# Patient Record
Sex: Male | Born: 1980 | Hispanic: Yes | Marital: Married | Smoking: Current some day smoker
Health system: Southern US, Community
[De-identification: ages and names within clinical notes are randomized; demographics above are authoritative.]

## PROBLEM LIST (undated history)

## (undated) DIAGNOSIS — Z789 Other specified health status: Secondary | ICD-10-CM

## (undated) DIAGNOSIS — K219 Gastro-esophageal reflux disease without esophagitis: Secondary | ICD-10-CM

## (undated) HISTORY — PX: APPENDECTOMY: SHX54

---

## 2017-03-16 ENCOUNTER — Encounter (HOSPITAL_COMMUNITY): Payer: Self-pay

## 2017-03-16 ENCOUNTER — Emergency Department (HOSPITAL_COMMUNITY): Payer: Self-pay

## 2017-03-16 ENCOUNTER — Other Ambulatory Visit: Payer: Self-pay

## 2017-03-16 ENCOUNTER — Inpatient Hospital Stay (HOSPITAL_COMMUNITY): Payer: Self-pay

## 2017-03-16 ENCOUNTER — Inpatient Hospital Stay (HOSPITAL_COMMUNITY)
Admission: EM | Admit: 2017-03-16 | Discharge: 2017-03-17 | DRG: 494 | Disposition: A | Discharge status: Home / Self Care | Payer: Self-pay | Attending: Student | Admitting: Student

## 2017-03-16 ENCOUNTER — Inpatient Hospital Stay (HOSPITAL_COMMUNITY): Payer: Self-pay | Admitting: Certified Registered"

## 2017-03-16 ENCOUNTER — Encounter (HOSPITAL_COMMUNITY)
Admission: EM | Disposition: A | Discharge status: Home / Self Care | Payer: Self-pay | Source: Home / Self Care | Attending: Student

## 2017-03-16 DIAGNOSIS — S82831A Other fracture of upper and lower end of right fibula, initial encounter for closed fracture: Secondary | ICD-10-CM | POA: Diagnosis present

## 2017-03-16 DIAGNOSIS — W11XXXA Fall on and from ladder, initial encounter: Secondary | ICD-10-CM | POA: Diagnosis present

## 2017-03-16 DIAGNOSIS — T148XXA Other injury of unspecified body region, initial encounter: Secondary | ICD-10-CM

## 2017-03-16 DIAGNOSIS — S82201A Unspecified fracture of shaft of right tibia, initial encounter for closed fracture: Principal | ICD-10-CM | POA: Diagnosis present

## 2017-03-16 DIAGNOSIS — S82401A Unspecified fracture of shaft of right fibula, initial encounter for closed fracture: Secondary | ICD-10-CM

## 2017-03-16 DIAGNOSIS — S82899A Other fracture of unspecified lower leg, initial encounter for closed fracture: Secondary | ICD-10-CM

## 2017-03-16 DIAGNOSIS — S82871A Displaced pilon fracture of right tibia, initial encounter for closed fracture: Secondary | ICD-10-CM | POA: Diagnosis present

## 2017-03-16 HISTORY — PX: EXTERNAL FIXATION LEG: SHX1549

## 2017-03-16 LAB — BASIC METABOLIC PANEL
ANION GAP: 9 (ref 5–15)
BUN: 8 mg/dL (ref 6–20)
CALCIUM: 8.4 mg/dL — AB (ref 8.9–10.3)
CO2: 24 mmol/L (ref 22–32)
Chloride: 104 mmol/L (ref 101–111)
Creatinine, Ser: 0.66 mg/dL (ref 0.61–1.24)
GFR calc non Af Amer: >60 mL/min (ref >60)
Glucose, Bld: 95 mg/dL (ref 65–99)
Potassium: 3.8 mmol/L (ref 3.5–5.1)
SODIUM: 137 mmol/L (ref 135–145)

## 2017-03-16 LAB — CBC
HCT: 38.9 % — ABNORMAL LOW (ref 39.0–52.0)
Hemoglobin: 13.3 g/dL (ref 13.0–17.0)
MCH: 31.4 pg (ref 26.0–34.0)
MCHC: 34.2 g/dL (ref 30.0–36.0)
MCV: 92 fL (ref 78.0–100.0)
Platelets: 209 10*3/uL (ref 150–400)
RBC: 4.23 MIL/uL (ref 4.22–5.81)
RDW: 13.6 % (ref 11.5–15.5)
WBC: 17 10*3/uL — AB (ref 4.0–10.5)

## 2017-03-16 LAB — SURGICAL PCR SCREEN
MRSA, PCR: NEGATIVE
Staphylococcus aureus: POSITIVE — AB

## 2017-03-16 SURGERY — EXTERNAL FIXATION, LOWER EXTREMITY
Anesthesia: General | Laterality: Right

## 2017-03-16 MED ORDER — ACETAMINOPHEN 325 MG PO TABS
650.0000 mg | ORAL_TABLET | Freq: Four times a day (QID) | ORAL | Status: DC | PRN
  Administered 2017-03-16: 650 mg via ORAL
  Filled 2017-03-16: qty 2

## 2017-03-16 MED ORDER — LACTATED RINGERS IV SOLN
INTRAVENOUS | Status: DC
  Administered 2017-03-16 (×2): via INTRAVENOUS

## 2017-03-16 MED ORDER — PROPOFOL 10 MG/ML IV BOLUS
INTRAVENOUS | Status: AC | PRN
Start: 2017-03-16 — End: 2017-03-16
  Administered 2017-03-16: 40 mg via INTRAVENOUS

## 2017-03-16 MED ORDER — PROPOFOL 10 MG/ML IV BOLUS
INTRAVENOUS | Status: AC | PRN
  Administered 2017-03-16: 80 mg via INTRAVENOUS

## 2017-03-16 MED ORDER — PROPOFOL 10 MG/ML IV BOLUS
100.0000 mg | Freq: Once | INTRAVENOUS | Status: DC
  Filled 2017-03-16: qty 20

## 2017-03-16 MED ORDER — POVIDONE-IODINE 10 % EX SWAB: 2.0000 "application " | Freq: Once | CUTANEOUS | Status: DC

## 2017-03-16 MED ORDER — ONDANSETRON HCL 4 MG/2ML IJ SOLN
INTRAMUSCULAR | Status: AC
  Filled 2017-03-16: qty 2

## 2017-03-16 MED ORDER — FENTANYL CITRATE (PF) 250 MCG/5ML IJ SOLN
INTRAMUSCULAR | Status: AC
  Filled 2017-03-16: qty 5

## 2017-03-16 MED ORDER — VANCOMYCIN HCL 1000 MG IV SOLR
INTRAVENOUS | Status: AC
  Filled 2017-03-16: qty 1000

## 2017-03-16 MED ORDER — HYDROMORPHONE HCL 1 MG/ML IJ SOLN
1.0000 mg | Freq: Once | INTRAMUSCULAR | Status: AC
  Administered 2017-03-16: 1 mg via INTRAVENOUS
  Filled 2017-03-16: qty 1

## 2017-03-16 MED ORDER — ROCURONIUM BROMIDE 10 MG/ML (PF) SYRINGE
PREFILLED_SYRINGE | INTRAVENOUS | Status: AC
  Filled 2017-03-16: qty 5

## 2017-03-16 MED ORDER — ONDANSETRON HCL 4 MG/2ML IJ SOLN
INTRAMUSCULAR | Status: DC | PRN
  Administered 2017-03-16: 4 mg via INTRAVENOUS

## 2017-03-16 MED ORDER — ENOXAPARIN SODIUM 40 MG/0.4ML ~~LOC~~ SOLN
40.0000 mg | SUBCUTANEOUS | Status: DC
  Administered 2017-03-17: 40 mg via SUBCUTANEOUS
  Filled 2017-03-16: qty 0.4

## 2017-03-16 MED ORDER — 0.9 % SODIUM CHLORIDE (POUR BTL) OPTIME
TOPICAL | Status: DC | PRN
  Administered 2017-03-16: 1000 mL

## 2017-03-16 MED ORDER — ONDANSETRON HCL 4 MG/2ML IJ SOLN: 4.0000 mg | Freq: Four times a day (QID) | INTRAMUSCULAR | Status: DC | PRN

## 2017-03-16 MED ORDER — LIDOCAINE HCL (CARDIAC) 20 MG/ML IV SOLN
INTRAVENOUS | Status: DC | PRN
  Administered 2017-03-16: 60 mg via INTRAVENOUS

## 2017-03-16 MED ORDER — CHLORHEXIDINE GLUCONATE 4 % EX LIQD
60.0000 mL | Freq: Once | CUTANEOUS | Status: AC
  Administered 2017-03-16: 4 via TOPICAL

## 2017-03-16 MED ORDER — OXYCODONE-ACETAMINOPHEN 5-325 MG PO TABS
1.0000 | ORAL_TABLET | ORAL | Status: DC | PRN
  Administered 2017-03-16: 1 via ORAL
  Filled 2017-03-16: qty 1

## 2017-03-16 MED ORDER — FENTANYL CITRATE (PF) 100 MCG/2ML IJ SOLN
INTRAMUSCULAR | Status: AC
  Filled 2017-03-16: qty 2

## 2017-03-16 MED ORDER — SUGAMMADEX SODIUM 200 MG/2ML IV SOLN
INTRAVENOUS | Status: DC | PRN
  Administered 2017-03-16: 320 mg via INTRAVENOUS

## 2017-03-16 MED ORDER — ONDANSETRON HCL 4 MG PO TABS: 4.0000 mg | ORAL_TABLET | Freq: Four times a day (QID) | ORAL | Status: DC | PRN

## 2017-03-16 MED ORDER — MIDAZOLAM HCL 5 MG/5ML IJ SOLN
INTRAMUSCULAR | Status: DC | PRN
  Administered 2017-03-16: 2 mg via INTRAVENOUS

## 2017-03-16 MED ORDER — PROPOFOL 10 MG/ML IV BOLUS
INTRAVENOUS | Status: AC | PRN
  Administered 2017-03-16: 40 mg via INTRAVENOUS

## 2017-03-16 MED ORDER — FENTANYL CITRATE (PF) 100 MCG/2ML IJ SOLN
INTRAMUSCULAR | Status: DC | PRN
  Administered 2017-03-16 (×2): 100 ug via INTRAVENOUS

## 2017-03-16 MED ORDER — OXYCODONE HCL 5 MG PO TABS: 5.0000 mg | ORAL_TABLET | Freq: Once | ORAL | Status: DC | PRN

## 2017-03-16 MED ORDER — CEFAZOLIN SODIUM-DEXTROSE 2-4 GM/100ML-% IV SOLN
2.0000 g | INTRAVENOUS | Status: AC
  Administered 2017-03-16: 2 g via INTRAVENOUS
  Filled 2017-03-16: qty 100

## 2017-03-16 MED ORDER — OXYCODONE HCL 5 MG/5ML PO SOLN
5.0000 mg | Freq: Once | ORAL | Status: DC | PRN
Start: 2017-03-16 — End: 2017-03-16

## 2017-03-16 MED ORDER — LIDOCAINE 2% (20 MG/ML) 5 ML SYRINGE
INTRAMUSCULAR | Status: AC
  Filled 2017-03-16: qty 5

## 2017-03-16 MED ORDER — CEFAZOLIN SODIUM-DEXTROSE 2-4 GM/100ML-% IV SOLN
2.0000 g | Freq: Three times a day (TID) | INTRAVENOUS | Status: DC
  Administered 2017-03-17 (×2): 2 g via INTRAVENOUS
  Filled 2017-03-16 (×3): qty 100

## 2017-03-16 MED ORDER — ONDANSETRON HCL 4 MG/2ML IJ SOLN
4.0000 mg | Freq: Once | INTRAMUSCULAR | Status: DC | PRN
Start: 2017-03-16 — End: 2017-03-16

## 2017-03-16 MED ORDER — KCL IN DEXTROSE-NACL 20-5-0.45 MEQ/L-%-% IV SOLN
INTRAVENOUS | Status: DC
  Administered 2017-03-16 – 2017-03-17 (×2): via INTRAVENOUS
  Filled 2017-03-16 (×3): qty 1000

## 2017-03-16 MED ORDER — PROPOFOL 10 MG/ML IV BOLUS
INTRAVENOUS | Status: AC
  Filled 2017-03-16: qty 20

## 2017-03-16 MED ORDER — SUGAMMADEX SODIUM 200 MG/2ML IV SOLN
INTRAVENOUS | Status: AC
  Filled 2017-03-16: qty 2

## 2017-03-16 MED ORDER — DEXAMETHASONE SODIUM PHOSPHATE 10 MG/ML IJ SOLN
INTRAMUSCULAR | Status: DC | PRN
  Administered 2017-03-16: 10 mg via INTRAVENOUS

## 2017-03-16 MED ORDER — MIDAZOLAM HCL 2 MG/2ML IJ SOLN
INTRAMUSCULAR | Status: AC
  Filled 2017-03-16: qty 2

## 2017-03-16 MED ORDER — ONDANSETRON HCL 4 MG/2ML IJ SOLN
4.0000 mg | Freq: Once | INTRAMUSCULAR | Status: AC
  Administered 2017-03-16: 4 mg via INTRAVENOUS
  Filled 2017-03-16: qty 2

## 2017-03-16 MED ORDER — DEXTROSE 5 % IV SOLN
500.0000 mg | Freq: Four times a day (QID) | INTRAVENOUS | Status: DC | PRN
  Filled 2017-03-16: qty 5

## 2017-03-16 MED ORDER — METHOCARBAMOL 500 MG PO TABS
500.0000 mg | ORAL_TABLET | Freq: Four times a day (QID) | ORAL | Status: DC | PRN
  Administered 2017-03-16 – 2017-03-17 (×3): 500 mg via ORAL
  Filled 2017-03-16 (×3): qty 1

## 2017-03-16 MED ORDER — FENTANYL CITRATE (PF) 100 MCG/2ML IJ SOLN
25.0000 ug | INTRAMUSCULAR | Status: DC | PRN
  Administered 2017-03-16: 25 ug via INTRAVENOUS
  Administered 2017-03-16: 50 ug via INTRAVENOUS

## 2017-03-16 MED ORDER — DEXMEDETOMIDINE HCL IN NACL 200 MCG/50ML IV SOLN
INTRAVENOUS | Status: DC | PRN
  Administered 2017-03-16: 8 ug via INTRAVENOUS

## 2017-03-16 MED ORDER — ROCURONIUM BROMIDE 100 MG/10ML IV SOLN
INTRAVENOUS | Status: DC | PRN
  Administered 2017-03-16: 50 mg via INTRAVENOUS

## 2017-03-16 MED ORDER — PROPOFOL 10 MG/ML IV BOLUS
INTRAVENOUS | Status: DC | PRN
  Administered 2017-03-16: 180 mg via INTRAVENOUS

## 2017-03-16 MED ORDER — ACETAMINOPHEN 650 MG RE SUPP: 650.0000 mg | Freq: Four times a day (QID) | RECTAL | Status: DC | PRN

## 2017-03-16 MED ORDER — DEXAMETHASONE SODIUM PHOSPHATE 10 MG/ML IJ SOLN
INTRAMUSCULAR | Status: AC
  Filled 2017-03-16: qty 1

## 2017-03-16 MED ORDER — MORPHINE SULFATE (PF) 4 MG/ML IV SOLN
2.0000 mg | INTRAVENOUS | Status: DC | PRN
  Administered 2017-03-16 – 2017-03-17 (×6): 2 mg via INTRAVENOUS
  Filled 2017-03-16 (×6): qty 1

## 2017-03-16 SURGICAL SUPPLY — 50 items
BANDAGE ACE 4X5 VEL STRL LF (GAUZE/BANDAGES/DRESSINGS) ×3 IMPLANT
BANDAGE ACE 6X5 VEL STRL LF (GAUZE/BANDAGES/DRESSINGS) ×3 IMPLANT
BIT DRILL 2.5X110 QC LCP DISP (BIT) ×3 IMPLANT
BNDG COHESIVE 4X5 TAN STRL (GAUZE/BANDAGES/DRESSINGS) ×3 IMPLANT
BNDG GAUZE ELAST 4 BULKY (GAUZE/BANDAGES/DRESSINGS) ×6 IMPLANT
BRUSH SCRUB SURG 4.25 DISP (MISCELLANEOUS) ×6 IMPLANT
CHLORAPREP W/TINT 26ML (MISCELLANEOUS) ×3 IMPLANT
CLAMP COMBI 8.0/11.0 LRG/MED (Clamp) ×6 IMPLANT
CLAMP COMBO MED CLIP-ON SELF (Clamp) ×12 IMPLANT
CLAMP LG MULTI PIN (Clamp) ×3 IMPLANT
CLAMP ROD ATTACHMENT (Clamp) ×3 IMPLANT
CLOSURE WOUND 1/2 X4 (GAUZE/BANDAGES/DRESSINGS)
COVER SURGICAL LIGHT HANDLE (MISCELLANEOUS) ×6 IMPLANT
DRAPE C-ARM 42X72 X-RAY (DRAPES) IMPLANT
DRAPE C-ARMOR (DRAPES) ×3 IMPLANT
DRAPE IMP U-DRAPE 54X76 (DRAPES) ×6 IMPLANT
DRAPE ORTHO SPLIT 77X108 STRL (DRAPES) ×4
DRAPE SURG ORHT 6 SPLT 77X108 (DRAPES) ×2 IMPLANT
DRAPE U-SHAPE 47X51 STRL (DRAPES) ×3 IMPLANT
DRILL BIT CANNULATED 3.5 (BIT) ×3 IMPLANT
DRSG MEPITEL 4X7.2 (GAUZE/BANDAGES/DRESSINGS) IMPLANT
ELECT REM PT RETURN 9FT ADLT (ELECTROSURGICAL) ×3
ELECTRODE REM PT RTRN 9FT ADLT (ELECTROSURGICAL) ×1 IMPLANT
GAUZE SPONGE 4X4 12PLY STRL (GAUZE/BANDAGES/DRESSINGS) ×3 IMPLANT
GAUZE XEROFORM 1X8 LF (GAUZE/BANDAGES/DRESSINGS) ×3 IMPLANT
GLOVE BIO SURGEON STRL SZ7.5 (GLOVE) ×12 IMPLANT
GLOVE BIOGEL PI IND STRL 7.5 (GLOVE) ×1 IMPLANT
GLOVE BIOGEL PI INDICATOR 7.5 (GLOVE) ×2
GOWN STRL REUS W/ TWL LRG LVL3 (GOWN DISPOSABLE) ×2 IMPLANT
GOWN STRL REUS W/TWL LRG LVL3 (GOWN DISPOSABLE) ×4
KIT BASIN OR (CUSTOM PROCEDURE TRAY) ×3 IMPLANT
KIT ROOM TURNOVER OR (KITS) ×3 IMPLANT
MANIFOLD NEPTUNE II (INSTRUMENTS) ×3 IMPLANT
NEEDLE 22X1 1/2 (OR ONLY) (NEEDLE) IMPLANT
NS IRRIG 1000ML POUR BTL (IV SOLUTION) ×3 IMPLANT
PACK ORTHO EXTREMITY (CUSTOM PROCEDURE TRAY) ×3 IMPLANT
PAD ARMBOARD 7.5X6 YLW CONV (MISCELLANEOUS) ×6 IMPLANT
PADDING CAST COTTON 6X4 STRL (CAST SUPPLIES) ×6 IMPLANT
ROD CARBON FIBER 8.0X160MM (Rod) ×6 IMPLANT
ROD CRBN FBR LRG EX-FX 11X350 (Rod) ×3 IMPLANT
ROD CRBN FBR LRG EX-FX 11X400 (Rod) ×3 IMPLANT
SCREW SHANZ 4.0X80MM (Screw) ×6 IMPLANT
SCREW SHANZ 6.0X130 (Screw) ×6 IMPLANT
SPONGE LAP 18X18 X RAY DECT (DISPOSABLE) ×3 IMPLANT
STAPLER VISISTAT 35W (STAPLE) ×3 IMPLANT
STRIP CLOSURE SKIN 1/2X4 (GAUZE/BANDAGES/DRESSINGS) IMPLANT
SUT PROLENE 0 CT (SUTURE) IMPLANT
TOWEL OR 17X24 6PK STRL BLUE (TOWEL DISPOSABLE) ×6 IMPLANT
TOWEL OR 17X26 10 PK STRL BLUE (TOWEL DISPOSABLE) ×6 IMPLANT
UNDERPAD 30X30 (UNDERPADS AND DIAPERS) ×3 IMPLANT

## 2017-03-16 NOTE — H&P (View-Only) (Signed)
Reason for Consult:Right ankle fx Referring Physician: Salem Caster is an 37 y.o. male.  HPI: Miguel Reid fell about 5 feet from a ladder and landed on his feet. He had immediate right ankle pain and fell to the ground. He could not get up and ambulate. He came to the ED where x-rays showed a right ankle fx with displacement but an intact mortise. Orthopedic surgery was consulted. 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 Visit conducted with aid of interpreter.  History reviewed. No pertinent past medical history.  History reviewed. No pertinent surgical history.  History reviewed. No pertinent family history.  Social History:  Infrequent tobacco and EtOH use. No drug use.  Allergies: No Known Allergies  Medications: I have reviewed the patient's current medications.  Lab Results Last 48 Hours  No results found for this or any previous visit (from the past 48 hour(s)).     Imaging Results (Last 48 hours)  Dg Ankle Complete Right  Result Date: 03/16/2017 CLINICAL DATA:  Right ankle injury due to a fall today. Initial encounter. EXAM: RIGHT ANKLE - COMPLETE 3+ VIEW COMPARISON:  None. FINDINGS: The patient has a fracture of the tibia. The fracture is mildly comminuted and extends from the distal diaphysis in an inferior and lateral orientation through the plafond. The fracture is impacted up to approximately 1.5 cm and medially displaced 1 cm. Transverse fracture of the distal diaphysis of the fibula is 5 cm above the tip of the lateral malleolus. The distal fragment is medially angulated 20 degrees. Associated soft tissue swelling is noted. IMPRESSION: Acute distal tibial and fibular fractures as described above. Electronically Signed   By: Drusilla Kanner M.D.   On: 03/16/2017 11:32     Review of Systems  Constitutional: Negative for weight loss.  HENT: Negative for ear discharge, ear pain, hearing loss and tinnitus.   Eyes: Negative for blurred  vision, double vision, photophobia and pain.  Respiratory: Negative for cough, sputum production and shortness of breath.   Cardiovascular: Negative for chest pain.  Gastrointestinal: Negative for abdominal pain, nausea and vomiting.  Genitourinary: Negative for dysuria, flank pain, frequency and urgency.  Musculoskeletal: Positive for joint pain (Right ankle). Negative for back pain, falls, myalgias and neck pain.  Neurological: Negative for dizziness, tingling, sensory change, focal weakness, loss of consciousness and headaches.  Endo/Heme/Allergies: Does not bruise/bleed easily.  Psychiatric/Behavioral: Negative for depression, memory loss and substance abuse. The patient is not nervous/anxious.    Blood pressure 114/81, pulse (!) 55, temperature 98.6 F (37 C), temperature source Oral, resp. rate (!) 22, weight 79.4 kg (175 lb), SpO2 100 %. Physical Exam  Constitutional: He appears well-developed and well-nourished. No distress.  HENT:  Head: Normocephalic and atraumatic.  Eyes: Conjunctivae are normal. Right eye exhibits no discharge. Left eye exhibits no discharge. No scleral icterus.  Neck: Normal range of motion.  Cardiovascular: Normal rate and regular rhythm.  Respiratory: Effort normal. No respiratory distress.  Musculoskeletal:  RLE     No traumatic wounds, ecchymosis, or rash             Ankle deformed, mod edema, severe TTP             No knee effusion             Knee stable to varus/ valgus and anterior/posterior stress             Sens DPN, SPN, TN intact  Motor EHL, ext, flex, evers 5/5             DP 2+, PT 2+, No significant edema  Neurological: He is alert.  Skin: Skin is warm and dry. He is not diaphoretic.  Psychiatric: He has a normal mood and affect. His behavior is normal.    Assessment/Plan: Fall from ladder Close right distal tib/fib fxs -- Will obtain CR, post-reduction films and CT then home if reduction adequate. NWB. F/u with Dr. Jena GaussHaddix  in office on Tuesday.  Addendum: Reduction too short for discharge, will ex fix in OR this afternoon.    Freeman CaldronMichael J. Belva Koziel, PA-C Orthopedic Surgery [phone] 03/16/2017, 1:39 PM

## 2017-03-16 NOTE — Procedures (Signed)
Procedure: Right ankle closed reduction  Indication: Right ankle fracture/dislocation  Surgeon: Charma IgoMichael Teo Moede, PA-C  Assist: None  Anesthesia: Conscious sedation with Propofol via EDP Shaune Pollack(Cameron Isaacs, MD)  EBL: None  Complications: None  Findings: After risks/benefits explained patient desires to undergo procedure. Consent obtained and time out performed. After adequate sedation the ankle was manipulated to reduce the fracture and splinted in place. Pt tolerated the procedure well.    Freeman CaldronMichael J. Yamilex Borgwardt, PA-C Orthopedic Surgery [phone]

## 2017-03-16 NOTE — ED Provider Notes (Signed)
[facility] EMERGENCY DEPARTMENT Provider Note   CSN: [id number] Arrival date & time: 03/16/17  1027     History   Chief Complaint Chief Complaint  Patient presents with  . Fall    HPI Miguel Reid is a 37 y.o. male.  HPI 37 year old male here with right ankle injury.  The patient was at work today when he tripped and fell.  He was trying to climb up a ladder when it broke and he fell just 2-3 feet but directly onto his inverted right ankle. He reports immediate onset of pain and deformity.  Reports pain is 10 out of 10, severe, aching, and throbbing.  Is worse with any movement.  He has not been able to put any weight on it.  No history of previous injuries.  Denies any numbness or tingling.  No alleviating factors.  No other significant medical complaints.  He did not hit his head.  No other trauma.  History reviewed. No pertinent past medical history.  There are no active problems to display for this patient.   History reviewed. No pertinent surgical history.     Home Medications    Prior to Admission medications   Not on File    Family History History reviewed. No pertinent family history.  Social History Social History   Tobacco Use  . Smoking status: Unknown If Ever Smoked  . Smokeless tobacco: Never Used  Substance Use Topics  . Alcohol use: Not on file  . Drug use: Not on file     Allergies   Patient has no known allergies.   Review of Systems Review of Systems  Constitutional: Negative for chills and fever.  Respiratory: Negative for shortness of breath.   Cardiovascular: Negative for chest pain.  Musculoskeletal: Positive for arthralgias and gait problem. Negative for neck pain.  Skin: Negative for rash and wound.  Allergic/Immunologic: Negative for immunocompromised state.  Neurological: Negative for weakness and numbness.  Hematological: Does not bruise/bleed easily.  All other systems reviewed and are  negative.    Physical Exam Updated Vital Signs BP 117/83   Pulse (!) 50   Temp 98.6 F (37 C) (Oral)   Resp 19   Wt 79.4 kg (175 lb)   SpO2 100%   Physical Exam  Constitutional: He is oriented to person, place, and time. He appears well-developed and well-nourished. No distress.  HENT:  Head: Normocephalic and atraumatic.  Eyes: Conjunctivae are normal.  Neck: Neck supple.  Cardiovascular: Normal rate, regular rhythm and normal heart sounds. Exam reveals no friction rub.  No murmur heard. Pulmonary/Chest: Effort normal and breath sounds normal. No respiratory distress. He has no wheezes. He has no rales.  Abdominal: He exhibits no distension.  Musculoskeletal: He exhibits no edema.  Neurological: He is alert and oriented to person, place, and time. He exhibits normal muscle tone.  Skin: Skin is warm. Capillary refill takes less than 2 seconds.  Psychiatric: He has a normal mood and affect.  Nursing note and vitals reviewed.   LOWER EXTREMITY EXAM: Right  INSPECTION & PALPATION: Obvious deformity to the distal ankle with market swelling.  No open wounds.  No tenting of skin.  SENSORY: sensation is intact to light touch in:  Superficial peroneal nerve distribution (over dorsum of foot) Deep peroneal nerve distribution (over first dorsal web space) Sural nerve distribution (over lateral aspect 5th metatarsal) Saphenous nerve distribution (over medial instep)  MOTOR:  + Motor EHL (great toe dorsiflexion) + FHL (great toe  plantar flexion)  + TA (ankle dorsiflexion)  + GSC (ankle plantar flexion)  VASCULAR: 2+ dorsalis pedis and posterior tibialis pulses Capillary refill < 2 sec, toes warm and well-perfused  COMPARTMENTS: Soft, warm, well-perfused No pain with passive extension No parethesias   ED Treatments / Results  Labs (all labs ordered are listed, but only abnormal results are displayed) Labs Reviewed  CBC  BASIC METABOLIC PANEL    EKG  EKG  Interpretation None       Radiology Dg Ankle Complete Right  Result Date: 03/16/2017 CLINICAL DATA:  Right ankle injury due to a fall today. Initial encounter. EXAM: RIGHT ANKLE - COMPLETE 3+ VIEW COMPARISON:  None. FINDINGS: The patient has a fracture of the tibia. The fracture is mildly comminuted and extends from the distal diaphysis in an inferior and lateral orientation through the plafond. The fracture is impacted up to approximately 1.5 cm and medially displaced 1 cm. Transverse fracture of the distal diaphysis of the fibula is 5 cm above the tip of the lateral malleolus. The distal fragment is medially angulated 20 degrees. Associated soft tissue swelling is noted. IMPRESSION: Acute distal tibial and fibular fractures as described above. Electronically Signed   By: Drusilla Kannerhomas  Dalessio M.D.   On: 03/16/2017 11:32   Dg Ankle Right Port  Result Date: 03/16/2017 CLINICAL DATA:  Ankle fracture, post reduction EXAM: PORTABLE RIGHT ANKLE - 2 VIEW COMPARISON:  Portable exam 1333 hours compared to 1133 hours FINDINGS: Plaster splint material obscures bone detail. Displaced and angulated oblique fracture of the distal RIGHT tibial metadiaphysis extending into tibial plafond. Displaced and angulated distal fibular fracture. Ankle joint alignment normal. No tarsal fracture identified. IMPRESSION: Displaced angulated distal RIGHT tibial and fibular fractures as above. Electronically Signed   By: Ulyses SouthwardMark  Boles M.D.   On: 03/16/2017 13:54    Procedures .Sedation Date/Time: 03/16/2017 1:11 PM Performed by: Shaune PollackIsaacs, Dyke Weible, MD Authorized by: Shaune PollackIsaacs, Tanylah Schnoebelen, MD   Consent:    Consent obtained:  Verbal   Consent given by:  Patient   Risks discussed:  Allergic reaction, dysrhythmia, inadequate sedation, nausea, prolonged hypoxia resulting in organ damage, prolonged sedation necessitating reversal, respiratory compromise necessitating ventilatory assistance and intubation and vomiting   Alternatives discussed:   Analgesia without sedation, anxiolysis and regional anesthesia Universal protocol:    Procedure explained and questions answered to patient or proxy's satisfaction: yes     Relevant documents present and verified: yes     Test results available and properly labeled: yes     Imaging studies available: yes     Required blood products, implants, devices, and special equipment available: yes     Site/side marked: yes     Immediately prior to procedure a time out was called: yes     Patient identity confirmation method:  Verbally with patient Indications:    Procedure necessitating sedation performed by:  Physician performing sedation   Intended level of sedation:  Deep Pre-sedation assessment:    Time since last food or drink:  8   ASA classification: class 1 - normal, healthy patient     Neck mobility: normal     Mouth opening:  3 or more finger widths   Thyromental distance:  4 finger widths   Mallampati score:  I - soft palate, uvula, fauces, pillars visible   Pre-sedation assessments completed and reviewed: airway patency, cardiovascular function, hydration status, mental status, nausea/vomiting, pain level, respiratory function and temperature   Immediate pre-procedure details:    Reassessment: Patient reassessed  immediately prior to procedure     Reviewed: vital signs, relevant labs/tests and NPO status     Verified: bag valve mask available, emergency equipment available, intubation equipment available, IV patency confirmed, oxygen available and suction available   Procedure details (see MAR for exact dosages):    Preoxygenation:  Nasal cannula   Sedation:  Propofol   Intra-procedure monitoring:  Blood pressure monitoring, cardiac monitor, continuous pulse oximetry, frequent LOC assessments, frequent vital sign checks and continuous capnometry   Intra-procedure events: none     Total Provider sedation time (minutes):  25 Post-procedure details:    Attendance: Constant attendance by  certified staff until patient recovered     Recovery: Patient returned to pre-procedure baseline     Post-sedation assessments completed and reviewed: airway patency, cardiovascular function, hydration status, mental status, nausea/vomiting, pain level, respiratory function and temperature     Patient is stable for discharge or admission: yes     Patient tolerance:  Tolerated well, no immediate complications   (including critical care time)  Medications Ordered in ED Medications  propofol (DIPRIVAN) 10 mg/mL bolus/IV push 100 mg ( Intravenous See Procedure Record 03/16/17 1325)  HYDROmorphone (DILAUDID) injection 1 mg (1 mg Intravenous Given 03/16/17 1238)  ondansetron (ZOFRAN) injection 4 mg (4 mg Intravenous Given 03/16/17 1238)  propofol (DIPRIVAN) 10 mg/mL bolus/IV push (80 mg Intravenous Given 03/16/17 1325)  propofol (DIPRIVAN) 10 mg/mL bolus/IV push (40 mg Intravenous Given 03/16/17 1326)  propofol (DIPRIVAN) 10 mg/mL bolus/IV push (40 mg Intravenous Given 03/16/17 1328)     Initial Impression / Assessment and Plan / ED Course  I have reviewed the triage vital signs and the nursing notes.  Pertinent labs & imaging results that were available during my care of the patient were reviewed by me and considered in my medical decision making (see chart for details).     37 year old otherwise healthy male here with comminuted right distal tib-fib fracture after fall at work. Orthopedics consulted and conscious sedation performed by myself with reduction by orthopedics.  Patient had mild improvement in alignment but persistent displacement.  He will be taken to the OR for ex-fix.  Final Clinical Impressions(s) / ED Diagnoses   Final diagnoses:  Closed fracture of shaft of right tibia and fibula, initial encounter      Shaune Pollack, MD 03/16/17 1410

## 2017-03-16 NOTE — ED Triage Notes (Signed)
Pt states he had a fall at work and has injury to his right ankle. Swelling noted. Pedal pulse intact.

## 2017-03-16 NOTE — ED Notes (Signed)
Attempted to call report x 1  

## 2017-03-16 NOTE — Anesthesia Procedure Notes (Signed)
Procedure Name: Intubation Date/Time: 03/16/2017 4:48 PM Performed by: Rosiland OzMeyers, Marilea Gwynne, CRNA Pre-anesthesia Checklist: Patient identified, Emergency Drugs available, Suction available, Patient being monitored and Timeout performed Patient Re-evaluated:Patient Re-evaluated prior to induction Oxygen Delivery Method: Circle system utilized Preoxygenation: Pre-oxygenation with 100% oxygen Induction Type: IV induction Ventilation: Mask ventilation without difficulty Laryngoscope Size: Miller and 3 Grade View: Grade I Tube type: Oral Tube size: 7.5 mm Number of attempts: 1 Airway Equipment and Method: Stylet Placement Confirmation: ETT inserted through vocal cords under direct vision,  CO2 detector and breath sounds checked- equal and bilateral Secured at: 21 cm Tube secured with: Tape Dental Injury: Teeth and Oropharynx as per pre-operative assessment

## 2017-03-16 NOTE — H&P (Signed)
Reason for Consult:Right ankle fx Referring Physician: C Isaacs  Miguel Reid is an 37 y.o. male.  HPI: Miguel fell about 5 feet from a ladder and landed on his feet. He had immediate right ankle pain and fell to the ground. He could not get up and ambulate. He came to the ED where x-rays showed a right ankle fx with displacement but an intact mortise. Orthopedic surgery was consulted. 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 Visit conducted with aid of interpreter.  History reviewed. No pertinent past medical history.  History reviewed. No pertinent surgical history.  History reviewed. No pertinent family history.  Social History:  Infrequent tobacco and EtOH use. No drug use.  Allergies: No Known Allergies  Medications: I have reviewed the patient's current medications.  Lab Results Last 48 Hours  No results found for this or any previous visit (from the past 48 hour(s)).     Imaging Results (Last 48 hours)  Dg Ankle Complete Right  Result Date: 03/16/2017 CLINICAL DATA:  Right ankle injury due to a fall today. Initial encounter. EXAM: RIGHT ANKLE - COMPLETE 3+ VIEW COMPARISON:  None. FINDINGS: The patient has a fracture of the tibia. The fracture is mildly comminuted and extends from the distal diaphysis in an inferior and lateral orientation through the plafond. The fracture is impacted up to approximately 1.5 cm and medially displaced 1 cm. Transverse fracture of the distal diaphysis of the fibula is 5 cm above the tip of the lateral malleolus. The distal fragment is medially angulated 20 degrees. Associated soft tissue swelling is noted. IMPRESSION: Acute distal tibial and fibular fractures as described above. Electronically Signed   By: Thomas  Dalessio M.D.   On: 03/16/2017 11:32     Review of Systems  Constitutional: Negative for weight loss.  HENT: Negative for ear discharge, ear pain, hearing loss and tinnitus.   Eyes: Negative for blurred  vision, double vision, photophobia and pain.  Respiratory: Negative for cough, sputum production and shortness of breath.   Cardiovascular: Negative for chest pain.  Gastrointestinal: Negative for abdominal pain, nausea and vomiting.  Genitourinary: Negative for dysuria, flank pain, frequency and urgency.  Musculoskeletal: Positive for joint pain (Right ankle). Negative for back pain, falls, myalgias and neck pain.  Neurological: Negative for dizziness, tingling, sensory change, focal weakness, loss of consciousness and headaches.  Endo/Heme/Allergies: Does not bruise/bleed easily.  Psychiatric/Behavioral: Negative for depression, memory loss and substance abuse. The patient is not nervous/anxious.    Blood pressure 114/81, pulse (!) 55, temperature 98.6 F (37 C), temperature source Oral, resp. rate (!) 22, weight 79.4 kg (175 lb), SpO2 100 %. Physical Exam  Constitutional: He appears well-developed and well-nourished. No distress.  HENT:  Head: Normocephalic and atraumatic.  Eyes: Conjunctivae are normal. Right eye exhibits no discharge. Left eye exhibits no discharge. No scleral icterus.  Neck: Normal range of motion.  Cardiovascular: Normal rate and regular rhythm.  Respiratory: Effort normal. No respiratory distress.  Musculoskeletal:  RLE     No traumatic wounds, ecchymosis, or rash             Ankle deformed, mod edema, severe TTP             No knee effusion             Knee stable to varus/ valgus and anterior/posterior stress             Sens DPN, SPN, TN intact               Motor EHL, ext, flex, evers 5/5             DP 2+, PT 2+, No significant edema  Neurological: He is alert.  Skin: Skin is warm and dry. He is not diaphoretic.  Psychiatric: He has a normal mood and affect. His behavior is normal.    Assessment/Plan: Fall from ladder Close right distal tib/fib fxs -- Will obtain CR, post-reduction films and CT then home if reduction adequate. NWB. F/u with Dr. Haddix  in office on Tuesday.  Addendum: Reduction too short for discharge, will ex fix in OR this afternoon.    Jemal Miskell J. Armelia Penton, PA-C Orthopedic Surgery [phone] 03/16/2017, 1:39 PM   

## 2017-03-16 NOTE — Progress Notes (Signed)
Orthopedic Tech Progress Note Patient Details:  Miguel Reid 1980-04-01 [id number]  Ortho Devices Type of Ortho Device: Ace wrap, Post (short leg) splint, Stirrup splint Ortho Device/Splint Location: rle Ortho Device/Splint Interventions: Application   Post Interventions Patient Tolerated: Well Instructions Provided: Care of device   Miguel Reid, Miguel Reid 03/16/2017, 1:49 PM

## 2017-03-16 NOTE — Anesthesia Preprocedure Evaluation (Signed)
Anesthesia Evaluation    Airway Mallampati: II  TM Distance: >3 FB Neck ROM: Full    Dental  (+) Teeth Intact, Dental Advisory Given   Pulmonary Current Smoker,    breath sounds clear to auscultation       Cardiovascular  Rhythm:Regular Rate:Normal     Neuro/Psych    GI/Hepatic   Endo/Other    Renal/GU      Musculoskeletal   Abdominal   Peds  Hematology   Anesthesia Other Findings   Reproductive/Obstetrics                             Anesthesia Physical Anesthesia Plan  ASA: I  Anesthesia Plan: General   Post-op Pain Management:    Induction: Intravenous  PONV Risk Score and Plan: Ondansetron and Dexamethasone  Airway Management Planned: Oral ETT  Additional Equipment:   Intra-op Plan:   Post-operative Plan: Extubation in OR  Informed Consent: I have reviewed the patients History and Physical, chart, labs and discussed the procedure including the risks, benefits and alternatives for the proposed anesthesia with the patient or authorized representative who has indicated his/her understanding and acceptance.   Dental advisory given  Plan Discussed with: Anesthesiologist and CRNA  Anesthesia Plan Comments:         Anesthesia Quick Evaluation

## 2017-03-16 NOTE — Progress Notes (Signed)
RN received report from the ED at 1450 shortly after at 1453 [facility] for me to give report to short stay. The patient had not arrived on the unit until 1455, the EKG had not been done, neither had the CHG or anything else just the consent I relayed this information to the short stay personnel Koleen Nimrod(Adrian) and she said that it was okay for him to come down still. With transport not being here yet my team and I were able to complete the EKG, PCR swab and a CHG before he went down for surgery.

## 2017-03-16 NOTE — ED Notes (Signed)
X-ray at bedside

## 2017-03-16 NOTE — Anesthesia Postprocedure Evaluation (Signed)
Anesthesia Post Note  Patient: Miguel Reid  Procedure(s) Performed: EXTERNAL FIXATION ANKLE (Right )     Patient location during evaluation: PACU Anesthesia Type: General Level of consciousness: awake and alert Pain management: pain level controlled Vital Signs Assessment: post-procedure vital signs reviewed and stable Respiratory status: spontaneous breathing, nonlabored ventilation, respiratory function stable and patient connected to nasal cannula oxygen Cardiovascular status: blood pressure returned to baseline and stable Postop Assessment: no apparent nausea or vomiting Anesthetic complications: no    Last Vitals:  Vitals:   03/16/17 1830 03/16/17 1840  BP:    Pulse: 66 78  Resp: 16 17  Temp: (!) 36.3 C   SpO2: 96% 95%    Last Pain:  Vitals:   03/16/17 1820  TempSrc:   PainSc: Asleep                 Shaquavia Whisonant COKER

## 2017-03-16 NOTE — Consult Note (Deleted)
Reason for Consult:Right ankle fx Referring Physician: Salem CasterC Isaacs  Miguel Reid is an 37 y.o. male.  HPI: Miguel fell about 5 feet from a ladder and landed on his feet. He had immediate right ankle pain and fell to the ground. He could not get up and ambulate. He came to the ED where x-rays showed a right ankle fx with displacement but an intact mortise. Orthopedic surgery was consulted. 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 Visit conducted with aid of interpreter.  History reviewed. No pertinent past medical history.  History reviewed. No pertinent surgical history.  History reviewed. No pertinent family history.  Social History:  Infrequent tobacco and EtOH use. No drug use.  Allergies: No Known Allergies  Medications: I have reviewed the patient's current medications.  No results found for this or any previous visit (from the past 48 hour(s)).  Dg Ankle Complete Right  Result Date: 03/16/2017 CLINICAL DATA:  Right ankle injury due to a fall today. Initial encounter. EXAM: RIGHT ANKLE - COMPLETE 3+ VIEW COMPARISON:  None. FINDINGS: The patient has a fracture of the tibia. The fracture is mildly comminuted and extends from the distal diaphysis in an inferior and lateral orientation through the plafond. The fracture is impacted up to approximately 1.5 cm and medially displaced 1 cm. Transverse fracture of the distal diaphysis of the fibula is 5 cm above the tip of the lateral malleolus. The distal fragment is medially angulated 20 degrees. Associated soft tissue swelling is noted. IMPRESSION: Acute distal tibial and fibular fractures as described above. Electronically Signed   By: Drusilla Kannerhomas  Dalessio M.D.   On: 03/16/2017 11:32    Review of Systems  Constitutional: Negative for weight loss.  HENT: Negative for ear discharge, ear pain, hearing loss and tinnitus.   Eyes: Negative for blurred vision, double vision, photophobia and pain.  Respiratory: Negative for cough,  sputum production and shortness of breath.   Cardiovascular: Negative for chest pain.  Gastrointestinal: Negative for abdominal pain, nausea and vomiting.  Genitourinary: Negative for dysuria, flank pain, frequency and urgency.  Musculoskeletal: Positive for joint pain (Right ankle). Negative for back pain, falls, myalgias and neck pain.  Neurological: Negative for dizziness, tingling, sensory change, focal weakness, loss of consciousness and headaches.  Endo/Heme/Allergies: Does not bruise/bleed easily.  Psychiatric/Behavioral: Negative for depression, memory loss and substance abuse. The patient is not nervous/anxious.    Blood pressure 114/81, pulse (!) 55, temperature 98.6 F (37 C), temperature source Oral, resp. rate (!) 22, weight 79.4 kg (175 lb), SpO2 100 %. Physical Exam  Constitutional: He appears well-developed and well-nourished. No distress.  HENT:  Head: Normocephalic and atraumatic.  Eyes: Conjunctivae are normal. Right eye exhibits no discharge. Left eye exhibits no discharge. No scleral icterus.  Neck: Normal range of motion.  Cardiovascular: Normal rate and regular rhythm.  Respiratory: Effort normal. No respiratory distress.  Musculoskeletal:  RLE No traumatic wounds, ecchymosis, or rash  Ankle deformed, mod edema, severe TTP  No knee effusion  Knee stable to varus/ valgus and anterior/posterior stress  Sens DPN, SPN, TN intact  Motor EHL, ext, flex, evers 5/5  DP 2+, PT 2+, No significant edema  Neurological: He is alert.  Skin: Skin is warm and dry. He is not diaphoretic.  Psychiatric: He has a normal mood and affect. His behavior is normal.    Assessment/Plan: Fall from ladder Close right distal tib/fib fxs -- Will obtain CR, post-reduction films and CT then home if reduction  adequate. NWB. F/u with Dr. Jena Gauss in office on Tuesday.  Addendum: Reduction too short for discharge, will ex fix in OR this afternoon.    Freeman Caldron, PA-C Orthopedic  Surgery [phone] 03/16/2017, 1:39 PM

## 2017-03-16 NOTE — Progress Notes (Signed)
[place]

## 2017-03-16 NOTE — Op Note (Signed)
OrthopaedicSurgeryOperativeNote (WUJ:[id number]) Date of Surgery: 03/16/2017  Admit Date: 03/16/2017   Diagnoses: Pre-Op Diagnoses: Right closed pilon fracture  Post-Op Diagnosis: Same  Procedures: 1. CPT 20692-Placement of spanning ankle external fixator 2. CPT 27825-Closed reduction of right pilon fracture   Surgeons: Primary: Roby Lofts, MD   Location:[facility] OR ROOM 05   AnesthesiaGeneral   Antibiotics:Ancef 2g preop   Tourniquettime:* No tourniquets in log * .  EstimatedBloodLoss:5 mL   Complications:None  Specimens:None  Implants: Implant Name Type Inv. Item Serial No. Manufacturer Lot No. LRB No. Used Action  MEDIUM CLAMP COMBINATION - [id number] Clamp MEDIUM CLAMP COMBINATION  SYNTHES MAXILLOFACIAL  Right 4 Implanted  ROD CARBON FIBER 8.0X160MM - [id number] Rod ROD CARBON FIBER 8.0X160MM  SYNTHES MAXILLOFACIAL  Right 2 Implanted  SCREW SHANZ 4.0X80MM - [id number] Screw SCREW SHANZ 4.0X80MM  SYNTHES TRAUMA  Right 2 Implanted  CLAMP COMBI 8.0/11.0 LRG/MED - [id number] Clamp CLAMP COMBI 8.0/11.0 LRG/MED  SYNTHES TRAUMA  Right 2 Implanted  CLAMP ROD ATTACHMENT - [id number] Clamp CLAMP ROD ATTACHMENT  SYNTHES TRAUMA  Right 1 Implanted  CLAMP LG MULTI PIN - [id number] Clamp CLAMP LG MULTI PIN  SYNTHES TRAUMA  Right 1 Implanted  ROD CARBON FIBER - [id number] Rod ROD CARBON FIBER  SYNTHES TRAUMA  Right 1 Implanted  ROD CARBON FIBER - [id number] Rod ROD CARBON FIBER  SYNTHES TRAUMA  Right 1 Implanted  Shanz Pin    SYNTHES TRAUMA  Right 2 Implanted    IndicationsforSurgery: 37 year old male who fell from a ladder earlier this AM. He had inability to bear weight and deformity of the ankle.  X-rays showed that he sustained an intra-articular pilon fracture. A closed reduction attempt was made which was unsuccessful and he was still too displaced to allow for discharge  I felt that a stage fixation was most appropriate with an external fixator and delayed ORIF.  I discussed  with him the risks and benefits of proceeding with this.  With the external fixation portion I discussed with her the risk of bleeding, infection, need for further surgery, development of DVT, nerve and blood vessel injury, and significant swelling and possibility of skin breakdown and blistering.  Patient understood and agreed to consent.  Operative Findings: 1. Displaced intra-articular right pilon fracture . 2.  Placement of spanning ankle external fixator with 2 5.0 mm tibial pins, 6.0 mm calcaneal transfixion pin, and 4.0 mm first metatarsal and fifth metatarsal pins.  Procedure: The patient was identified in the preoperative holding area. Consent was confirmed with the patient and their family and all questions were answered. The operative extremity was marked after confirmation with the patient. They were then brought back to the operating room by our anesthesia colleagues. They were carefully transferred over to a radiolucent flat top table and placed under general anesthesia.The splint was cut down and there was significant swelling and I felt that an incision would not be inappropriate at this point and that external fixation would be most appropriate as how unstable his ankle joint was. The operative extremity was then prepped and draped in usual sterile fashion. A preoperative timeout was performed to verify the patient, the procedure, and the extremity. Preoperative antibiotics were dosed.  Using a 6-hole pin clamp as a guide I made two percutaneous incisions and predrilled the tibia and placed 5.23mm threaded half pins, gaining excellent purchase. A lateral fluoroscopic view was obtained to confirm adequate length through the far cortex. A small incision was made over the medial calcaneus a 6.67mm  calcaneal transfixation pin was placed through the heel and lateral fluoro was used to confirm placement. Fluoroscopy was then used to visualize the base of the 1st and 5th metatarsals and a  percutaneous incision was made over each. A 2.265mm drill bit was used to predrill both cortices of each bone and a 4.370mm pin was placed. Pin to bar clamps were placed and a medium ex-fix bar was placed connecting the metatarsal pins to the transfixion pin in the calcaneus on both the medial and lateral side. Large bars were used to connect the tibial pin clamp to the medium bars and a reduction maneuver was performed.   The talus was reduced underneath the plafond and all of the clamps were tightened. A lateral view was obtained to show adequate reduction of the talus and the clamps were final tightened. The pin sites were dressed with kerlix and the drapes were broken down. The patient was then awoken from anesthesia and taken to the PACU in stable condition.  Post Op Plan/Instructions: The patient will be admitted for pain control.  He will receive postoperative antibiotics.  She will receive aspirin for DVT prophylaxis He will be discharged home and plan for return as an outpatient for definitive surgery.  I was present and performed the entire surgery.  Truitt MerleKevin Xitlaly Ault, MD [facility]

## 2017-03-16 NOTE — Transfer of Care (Signed)
Immediate Anesthesia Transfer of Care Note  Patient: Miguel Reid  Procedure(s) Performed: EXTERNAL FIXATION ANKLE (Right )  Patient Location: PACU  Anesthesia Type:General  Level of Consciousness: awake and patient cooperative  Airway & Oxygen Therapy: Patient Spontanous Breathing  Post-op Assessment: Report given to RN and Post -op Vital signs reviewed and stable  Post vital signs: Reviewed and stable  Last Vitals:  Vitals:   03/16/17 1345 03/16/17 1430  BP: 117/83 118/81  Pulse: (!) 50 63  Resp: 19 16  Temp:    SpO2: 100% 99%    Last Pain:  Vitals:   03/16/17 1500  TempSrc:   PainSc: 4          Complications: No apparent anesthesia complications

## 2017-03-17 ENCOUNTER — Encounter (HOSPITAL_COMMUNITY): Payer: Self-pay | Admitting: Student

## 2017-03-17 LAB — CBC
HCT: 39.4 % (ref 39.0–52.0)
HEMOGLOBIN: 13 g/dL (ref 13.0–17.0)
MCH: 30.5 pg (ref 26.0–34.0)
MCHC: 33 g/dL (ref 30.0–36.0)
MCV: 92.5 fL (ref 78.0–100.0)
Platelets: 215 10*3/uL (ref 150–400)
RBC: 4.26 MIL/uL (ref 4.22–5.81)
RDW: 13.8 % (ref 11.5–15.5)
WBC: 15.2 10*3/uL — ABNORMAL HIGH (ref 4.0–10.5)

## 2017-03-17 LAB — HIV ANTIBODY (ROUTINE TESTING W REFLEX): HIV Screen 4th Generation wRfx: NONREACTIVE

## 2017-03-17 MED ORDER — MUPIROCIN 2 % EX OINT
1.0000 "application " | TOPICAL_OINTMENT | Freq: Two times a day (BID) | CUTANEOUS | Status: DC
  Administered 2017-03-17 (×2): 1 via NASAL
  Filled 2017-03-17: qty 22

## 2017-03-17 MED ORDER — ASPIRIN EC 325 MG PO TBEC: 325.0000 mg | DELAYED_RELEASE_TABLET | Freq: Every day | ORAL | 0 refills | Status: AC

## 2017-03-17 MED ORDER — OXYCODONE-ACETAMINOPHEN 5-325 MG PO TABS
1.0000 | ORAL_TABLET | ORAL | Status: DC | PRN
Start: 2017-03-17 — End: 2017-03-18
  Administered 2017-03-17 (×2): 2 via ORAL
  Filled 2017-03-17 (×2): qty 2

## 2017-03-17 MED ORDER — CHLORHEXIDINE GLUCONATE CLOTH 2 % EX PADS
6.0000 | MEDICATED_PAD | Freq: Every day | CUTANEOUS | Status: DC
  Administered 2017-03-17: 6 via TOPICAL

## 2017-03-17 MED ORDER — OXYCODONE-ACETAMINOPHEN 5-325 MG PO TABS: 1.0000 | ORAL_TABLET | ORAL | 0 refills | Status: DC | PRN

## 2017-03-17 MED ORDER — METHOCARBAMOL 750 MG PO TABS: 750.0000 mg | ORAL_TABLET | Freq: Four times a day (QID) | ORAL | 0 refills | Status: DC

## 2017-03-17 NOTE — Progress Notes (Signed)
Physical Therapy Treatment Patient Details Name: Miguel Reid MRN: [id number] DOB: 09-01-1980 Today's Date: 03/17/2017    History of Present Illness Pt. is a 37 y.o. male with no significant PMH admitted after sustaining a fall from a ladder with a right displaced pilon fracture and is s/p external fixation procedure.     PT Comments    Session focused on gait training with crutches in order to determine safest assistive device for mobility. During ambulation with crutches, patient demonstrated good balance and negotiated turns and obstacles with min guard assist. Patient displayed adherence to nonweightbearing precautions throughout session. Patient could benefit from further reinforcement of transfer training.    Follow Up Recommendations  Supervision for mobility/OOB;Outpatient PT     Equipment Recommendations  3in1 (PT);Crutches    Recommendations for Other Services       Precautions / Restrictions Precautions Precautions: Fall Restrictions Weight Bearing Restrictions: Yes RLE Weight Bearing: Non weight bearing    Mobility  Bed Mobility Overal bed mobility: Modified Independent                Transfers Overall transfer level: Modified independent Equipment used: Crutches             General transfer comment: Patient is modified independent with sit to stand transfers, however, does need frequent verbal cueing for safety. PT demonstrated and explained transfer technique with crutches to patient; patient able to perform correctly 2/4 trials.    Ambulation/Gait Ambulation/Gait assistance: Min guard Ambulation Distance (Feet): 75 Feet Assistive device: Crutches Gait Pattern/deviations: Trunk flexed   Gait velocity interpretation: Below normal speed for age/gender General Gait Details: Patient requiring min guard for ambulation with crutches. Provided VC's for upright posture and smaller step length initially.    Stairs            Wheelchair  Mobility    Modified Rankin (Stroke Patients Only)       Balance Overall balance assessment: Needs assistance Sitting-balance support: No upper extremity supported;Feet supported Sitting balance-Leahy Scale: Normal     Standing balance support: No upper extremity supported Standing balance-Leahy Scale: Fair                              Cognition Arousal/Alertness: Awake/alert Behavior During Therapy: Impulsive Overall Cognitive Status: Within Functional Limits for tasks assessed                                        Exercises      General Comments General comments (skin integrity, edema, etc.): Patient and family educated on utilizing call bell to ask for assistance before attempting to transfer.       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 : None Difficulty sitting down on and standing up from a chair with arms (e.g., wheelchair, bedside commode, etc,.)?: None Help needed moving to and from a bed to chair (including a wheelchair)?: A Little Help needed walking in hospital room?: A Little Help needed climbing 3-5 steps with a railing? : A Little 6 Click Score: 20    End of Session Equipment Utilized During Treatment: Gait belt Activity Tolerance: Patient tolerated treatment well Patient left: in chair;with call bell/phone within reach;with family/visitor present Nurse Communication: Mobility status PT Visit Diagnosis: Unsteadiness on feet (R26.81);Pain Pain - Right/Left: Right Pain - part of body: Ankle and joints of foot     Time: [id number] PT Time Calculation (min) (ACUTE ONLY): 29 min  Charges:  $Gait Training: 23-37 mins                    G Codes:       Laurina Bustlearoline Dragan Tamburrino, PT, DPT Acute Rehabilitation Services    Vanetta MuldersCarloine H Sidi Dzikowski 03/17/2017, 1:11 PM

## 2017-03-17 NOTE — Evaluation (Signed)
Physical Therapy Evaluation Patient Details Name: Miguel Reid MRN: [id number] DOB: 16-Dec-1980 Today's Date: 03/17/2017   History of Present Illness  Pt. is a 37 y.o. male with no significant PMH admitted after sustaining a fall from a ladder with a right displaced pilon fracture and is s/p external fixation procedure.   Clinical Impression  Pt admitted with above diagnosis. Pt currently with functional limitations due to the deficits listed below (see PT Problem List). Pt is motivated to participate in therapy but is slightly impulsive and demonstrates decreased safety awareness with mobility. Overall, patient is displaying good pain control and ambulated 75 feet with RW and min guard. Main deficit is balance. Could benefit from gait training with crutches in future sessions.  Pt will benefit from skilled PT to increase their independence and safety with mobility to allow discharge to the venue listed below.       Follow Up Recommendations Supervision for mobility/OOB;Outpatient PT    Equipment Recommendations  Rolling walker with 5" wheels;3in1 (PT)    Recommendations for Other Services       Precautions / Restrictions Precautions Precautions: Fall Restrictions Weight Bearing Restrictions: Yes RLE Weight Bearing: Non weight bearing      Mobility  Bed Mobility Overal bed mobility: Modified Independent                Transfers Overall transfer level: Modified independent Equipment used: Rolling walker (2 wheeled)             General transfer comment: Patient is modified independent with sit to stand transfers, however, he needs VC's for safety, including pushing hands from stable surface.   Ambulation/Gait Ambulation/Gait assistance: Min guard;Min assist Ambulation Distance (Feet): 75 Feet Assistive device: Rolling walker (2 wheeled) Gait Pattern/deviations: Trunk flexed   Gait velocity interpretation: Below normal speed for age/gender General Gait Details:  Patient requiring min guard-min assist for hopping with RW. Min assist provided for LOB when negotiating threshold out of bathroom.   Stairs            Wheelchair Mobility    Modified Rankin (Stroke Patients Only)       Balance Overall balance assessment: Needs assistance Sitting-balance support: No upper extremity supported;Feet supported Sitting balance-Leahy Scale: Normal     Standing balance support: No upper extremity supported Standing balance-Leahy Scale: Fair                               Pertinent Vitals/Pain Pain Assessment: Faces Faces Pain Scale: Hurts a little bit Pain Location: R foot Pain Descriptors / Indicators: Discomfort Pain Intervention(s): Monitored during session;Premedicated before session    Home Living Family/patient expects to be discharged to:: Private residence Living Arrangements: Spouse/significant other;Children Available Help at Discharge: Family Type of Home: House Home Access: Level entry     Home Layout: One level Home Equipment: None      Prior Function Level of Independence: Independent         Comments: Works as a Microbiologistcarpenter     Hand Dominance        Extremity/Trunk Assessment   Upper Extremity Assessment Upper Extremity Assessment: Overall WFL for tasks assessed    Lower Extremity Assessment Lower Extremity Assessment: Overall WFL for tasks assessed;RLE deficits/detail RLE: Unable to fully assess due to immobilization    Cervical / Trunk Assessment Cervical / Trunk Assessment: Other exceptions Cervical / Trunk Exceptions: Bilateral shoulder elevation and forward head during ambulation  Communication  Communication: Interpreter utilized(Patient is Spanish speaking. Interpreter ID: [id number] )  Cognition Arousal/Alertness: Awake/alert Behavior During Therapy: Impulsive Overall Cognitive Status: Within Functional Limits for tasks assessed                                         General Comments General comments (skin integrity, edema, etc.): Patient and family educated on utilizing call bell to ask for assistance before attempting to transfer.     Exercises     Assessment/Plan    PT Assessment Patient needs continued PT services  PT Problem List Decreased balance;Decreased mobility;Decreased safety awareness;Decreased activity tolerance       PT Treatment Interventions DME instruction;Gait training;Stair training;Functional mobility training;Therapeutic activities;Therapeutic exercise;Balance training;Patient/family education    PT Goals (Current goals can be found in the Care Plan section)  Acute Rehab PT Goals Patient Stated Goal: Return to work Time For Goal Achievement: 03/22/17 Potential to Achieve Goals: Good    Frequency Min 5X/week   Barriers to discharge        Co-evaluation               AM-PAC PT "6 Clicks" Daily Activity  Outcome Measure Difficulty turning over in bed (including adjusting bedclothes, sheets and blankets)?: A Little Difficulty moving from lying on back to sitting on the side of the bed? : None Difficulty sitting down on and standing up from a chair with arms (e.g., wheelchair, bedside commode, etc,.)?: None Help needed moving to and from a bed to chair (including a wheelchair)?: A Little Help needed walking in hospital room?: A Little Help needed climbing 3-5 steps with a railing? : A Little 6 Click Score: 20    End of Session Equipment Utilized During Treatment: Gait belt Activity Tolerance: Patient tolerated treatment well Patient left: in chair;with call bell/phone within reach;with family/visitor present Nurse Communication: Mobility status PT Visit Diagnosis: Unsteadiness on feet (R26.81);Pain Pain - Right/Left: Right Pain - part of body: Ankle and joints of foot    Time: 2725-3664 PT Time Calculation (min) (ACUTE ONLY): 29 min   Charges:   PT Evaluation $PT Eval Low Complexity: 1 Low PT  Treatments $Gait Training: 8-22 mins   PT G Codes:        Laurina Bustle, PT, DPT Acute Rehabilitation Services    Vanetta Mulders 03/17/2017, 11:24 AM

## 2017-03-17 NOTE — Discharge Summary (Signed)
Orthopaedic Trauma Service (OTS)  Patient ID: Miguel Reid MRN: [id number] DOB/AGE: [birth year] 37 y.o.  Admit date: 03/16/2017 Discharge date: 03/17/2017  Admission Diagnoses: Closed right pilon fracture, initial encounter  Discharge Diagnoses:  Active Problems:   Closed right pilon fracture, initial encounter  History reviewed. No pertinent past medical history.  Procedures Performed: 03/16/2017: Procedures: 1. CPT 20692-Placement of spanning ankle external fixator 2. CPT 27825-Closed reduction of right pilon fracture  Discharged Condition: good  Hospital Course: The patient was admitted and underwent external fixation of his right pilon fracture.  He was admitted overnight for pain control.  The next day he mobilize with physical therapy and was cleared for discharge home.  Upon discharge his pain was well controlled with oral medications, he was tolerating regular diet.  He was also voiding spontaneously.  He was discharged on postoperative day 1 without difficulty.  Consults: None  Significant Diagnostic Studies: None  Treatments: surgery: None  Discharge Exam:  Vitals:   03/17/17 0455 03/17/17 1300  BP: 127/83 126/65  Pulse: 62 74  Resp: 17 17  Temp: 98 F (36.7 C) 98.7 F (37.1 C)  SpO2: 100% 99%   Gen: NAD, AAOx3 RLE: Ex-fix in place, pin sites clean dry and intact. Neurovascularly intact. Significant swelling over leg  Disposition: Home  Allergies as of 03/17/2017   No Known Allergies     Medication List    TAKE these medications   aspirin EC 325 MG tablet Take 1 tablet (325 mg total) by mouth daily.   methocarbamol 750 MG tablet Commonly known as:  ROBAXIN-750 Take 1 tablet (750 mg total) by mouth 4 (four) times daily.   oxyCODONE-acetaminophen 5-325 MG tablet Commonly known as:  PERCOCET/ROXICET Take 1-2 tablets by mouth every 4 (four) hours as needed for severe pain.            Durable Medical Equipment  (From admission, onward)        Start     Ordered   03/16/17 1339  For home use only DME Crutches  Once     03/16/17 1338     Follow-up Information    Alleyah Twombly, Gillie MannersKevin P, MD Follow up on 03/21/2017.   Specialty:  Orthopedic Surgery Why:  9:00AM Contact information: [address] [place] [address] [phone]           Discharge Instructions and Plan: The patient will be nonweightbearing to the right lower extremity.  He will return to see me on March 2 for skin check and possible rescheduling of surgery.  He will be on aspirin for DVT prophylaxis.  Signed:  Roby LoftsKevin P. Breea Loncar, MD [facility] [phone] (phone) 03/17/2017, 3:56 PM

## 2017-03-17 NOTE — Progress Notes (Signed)
Orthopedic Tech Progress Note Patient Details:  Miguel Reid 1980-12-17 [id number]  Patient ID: Miguel Reid, male   DOB: 1980-12-17, 37 y.o.   MRN: [id number]   Miguel Reid, Miguel Reid 03/17/2017, 1:20 PM Viewed order from RN order list

## 2017-03-17 NOTE — Discharge Instructions (Signed)
Mantenga la frula intacta y seca. No ponga peso sobre la pierna derecha. Mantenga la pierna derecha elevada siempre que sea posible. Es al tobillo derecho durante 30 minutos 4 veces al [facility].

## 2017-03-17 NOTE — Progress Notes (Signed)
Orthopedic Tech Progress Note Patient Details:  Lavetta Nielsensais Giral Sep 14, 1980 [id number]  Ortho Devices Type of Ortho Device: Crutches Ortho Device/Splint Location: rle Ortho Device/Splint Interventions: Application   Post Interventions Patient Tolerated: Well Instructions Provided: Care of device   Nikki DomCrawford, [place] Veldman 03/17/2017, 1:19 PM

## 2017-03-21 ENCOUNTER — Ambulatory Visit: Payer: Self-pay | Admitting: Student

## 2017-03-21 DIAGNOSIS — S82871A Displaced pilon fracture of right tibia, initial encounter for closed fracture: Secondary | ICD-10-CM

## 2017-03-23 MED ORDER — CEFAZOLIN SODIUM-DEXTROSE 2-4 GM/100ML-% IV SOLN
2.0000 g | INTRAVENOUS | Status: AC
  Administered 2017-03-24: 2 g via INTRAVENOUS
  Filled 2017-03-23: qty 100

## 2017-03-23 NOTE — Progress Notes (Signed)
Attempted 3 times via [employer] to contact pt. Each time there was a message stating "line is currently busy, try again another time". Pharmacy was also unable to contact pt. I did call Dr. Luvenia StarchHaddix's office earlier and they had no other number available for pt.

## 2017-03-24 ENCOUNTER — Encounter (HOSPITAL_COMMUNITY): Payer: Self-pay | Admitting: Certified Registered"

## 2017-03-24 ENCOUNTER — Ambulatory Visit (HOSPITAL_COMMUNITY): Payer: Self-pay

## 2017-03-24 ENCOUNTER — Encounter (HOSPITAL_COMMUNITY)
Admission: RE | Disposition: A | Discharge status: Home / Self Care | Payer: Self-pay | Source: Ambulatory Visit | Attending: Student

## 2017-03-24 ENCOUNTER — Observation Stay (HOSPITAL_COMMUNITY)
Admission: RE | Admit: 2017-03-24 | Discharge: 2017-03-25 | Disposition: A | Discharge status: Home / Self Care | Payer: Self-pay | Source: Ambulatory Visit | Attending: Student | Admitting: Student

## 2017-03-24 ENCOUNTER — Ambulatory Visit (HOSPITAL_COMMUNITY): Payer: Self-pay | Admitting: Certified Registered"

## 2017-03-24 ENCOUNTER — Observation Stay (HOSPITAL_COMMUNITY): Payer: Self-pay

## 2017-03-24 DIAGNOSIS — Z419 Encounter for procedure for purposes other than remedying health state, unspecified: Secondary | ICD-10-CM

## 2017-03-24 DIAGNOSIS — W11XXXA Fall on and from ladder, initial encounter: Secondary | ICD-10-CM | POA: Insufficient documentation

## 2017-03-24 DIAGNOSIS — S82871A Displaced pilon fracture of right tibia, initial encounter for closed fracture: Principal | ICD-10-CM | POA: Insufficient documentation

## 2017-03-24 DIAGNOSIS — S82831A Other fracture of upper and lower end of right fibula, initial encounter for closed fracture: Secondary | ICD-10-CM | POA: Insufficient documentation

## 2017-03-24 DIAGNOSIS — F17200 Nicotine dependence, unspecified, uncomplicated: Secondary | ICD-10-CM | POA: Insufficient documentation

## 2017-03-24 DIAGNOSIS — T148XXA Other injury of unspecified body region, initial encounter: Secondary | ICD-10-CM

## 2017-03-24 HISTORY — DX: Gastro-esophageal reflux disease without esophagitis: K21.9

## 2017-03-24 HISTORY — PX: ORIF ANKLE FRACTURE: SHX5408

## 2017-03-24 HISTORY — DX: Other specified health status: Z78.9

## 2017-03-24 HISTORY — PX: EXTERNAL FIXATION REMOVAL: SHX5040

## 2017-03-24 SURGERY — OPEN REDUCTION INTERNAL FIXATION (ORIF) ANKLE FRACTURE
Anesthesia: Regional | Site: Ankle | Laterality: Right

## 2017-03-24 MED ORDER — MIDAZOLAM HCL 2 MG/2ML IJ SOLN
INTRAMUSCULAR | Status: AC
  Filled 2017-03-24: qty 2

## 2017-03-24 MED ORDER — DEXAMETHASONE SODIUM PHOSPHATE 4 MG/ML IJ SOLN
INTRAMUSCULAR | Status: DC | PRN
  Administered 2017-03-24: 10 mg via INTRAVENOUS

## 2017-03-24 MED ORDER — BACITRACIN ZINC 500 UNIT/GM EX OINT
TOPICAL_OINTMENT | CUTANEOUS | Status: AC
  Filled 2017-03-24: qty 28.35

## 2017-03-24 MED ORDER — PHENYLEPHRINE 40 MCG/ML (10ML) SYRINGE FOR IV PUSH (FOR BLOOD PRESSURE SUPPORT)
PREFILLED_SYRINGE | INTRAVENOUS | Status: AC
  Filled 2017-03-24: qty 10

## 2017-03-24 MED ORDER — ONDANSETRON HCL 4 MG/2ML IJ SOLN
INTRAMUSCULAR | Status: DC | PRN
  Administered 2017-03-24: 4 mg via INTRAVENOUS

## 2017-03-24 MED ORDER — 0.9 % SODIUM CHLORIDE (POUR BTL) OPTIME
TOPICAL | Status: DC | PRN
  Administered 2017-03-24: 1000 mL

## 2017-03-24 MED ORDER — DIPHENHYDRAMINE HCL 12.5 MG/5ML PO ELIX: 12.5000 mg | ORAL_SOLUTION | ORAL | Status: DC | PRN

## 2017-03-24 MED ORDER — SUGAMMADEX SODIUM 200 MG/2ML IV SOLN
INTRAVENOUS | Status: AC
  Filled 2017-03-24: qty 2

## 2017-03-24 MED ORDER — CHLORHEXIDINE GLUCONATE 4 % EX LIQD: 60.0000 mL | Freq: Once | CUTANEOUS | Status: DC

## 2017-03-24 MED ORDER — BACITRACIN ZINC 500 UNIT/GM EX OINT
TOPICAL_OINTMENT | CUTANEOUS | Status: DC | PRN
  Administered 2017-03-24: 1 via TOPICAL

## 2017-03-24 MED ORDER — ROPIVACAINE HCL 7.5 MG/ML IJ SOLN
INTRAMUSCULAR | Status: DC | PRN
  Administered 2017-03-24: 40 mL via PERINEURAL

## 2017-03-24 MED ORDER — VANCOMYCIN HCL 1000 MG IV SOLR
INTRAVENOUS | Status: AC
  Filled 2017-03-24: qty 1000

## 2017-03-24 MED ORDER — ONDANSETRON HCL 4 MG/2ML IJ SOLN: 4.0000 mg | Freq: Four times a day (QID) | INTRAMUSCULAR | Status: DC | PRN

## 2017-03-24 MED ORDER — CLONIDINE HCL (ANALGESIA) 100 MCG/ML EP SOLN
EPIDURAL | Status: DC | PRN
  Administered 2017-03-24: 60 ug

## 2017-03-24 MED ORDER — LIDOCAINE 2% (20 MG/ML) 5 ML SYRINGE
INTRAMUSCULAR | Status: DC | PRN
  Administered 2017-03-24: 80 mg via INTRAVENOUS

## 2017-03-24 MED ORDER — DEXAMETHASONE SODIUM PHOSPHATE 4 MG/ML IJ SOLN
INTRAMUSCULAR | Status: DC | PRN
  Administered 2017-03-24: 8 mg via INTRAVENOUS

## 2017-03-24 MED ORDER — PROPOFOL 10 MG/ML IV BOLUS
INTRAVENOUS | Status: AC
  Filled 2017-03-24: qty 20

## 2017-03-24 MED ORDER — VANCOMYCIN HCL 1000 MG IV SOLR
INTRAVENOUS | Status: DC | PRN
Start: 2017-03-24 — End: 2017-03-24
  Administered 2017-03-24: 1000 mg

## 2017-03-24 MED ORDER — POVIDONE-IODINE 10 % EX SWAB: 2.0000 "application " | Freq: Once | CUTANEOUS | Status: DC

## 2017-03-24 MED ORDER — ROCURONIUM BROMIDE 100 MG/10ML IV SOLN
INTRAVENOUS | Status: DC | PRN
  Administered 2017-03-24: 30 mg via INTRAVENOUS
  Administered 2017-03-24: 50 mg via INTRAVENOUS

## 2017-03-24 MED ORDER — LIDOCAINE HCL (CARDIAC) 20 MG/ML IV SOLN
INTRAVENOUS | Status: AC
  Filled 2017-03-24: qty 5

## 2017-03-24 MED ORDER — MORPHINE SULFATE (PF) 2 MG/ML IV SOLN: 2.0000 mg | INTRAVENOUS | Status: DC | PRN

## 2017-03-24 MED ORDER — PROPOFOL 10 MG/ML IV BOLUS
INTRAVENOUS | Status: DC | PRN
  Administered 2017-03-24: 200 mg via INTRAVENOUS

## 2017-03-24 MED ORDER — EPHEDRINE 5 MG/ML INJ
INTRAVENOUS | Status: AC
  Filled 2017-03-24: qty 10

## 2017-03-24 MED ORDER — ACETAMINOPHEN 650 MG RE SUPP: 650.0000 mg | Freq: Four times a day (QID) | RECTAL | Status: DC | PRN

## 2017-03-24 MED ORDER — CEFAZOLIN SODIUM-DEXTROSE 2-4 GM/100ML-% IV SOLN
2.0000 g | Freq: Three times a day (TID) | INTRAVENOUS | Status: DC
  Administered 2017-03-24 – 2017-03-25 (×2): 2 g via INTRAVENOUS
  Filled 2017-03-24 (×3): qty 100

## 2017-03-24 MED ORDER — EPHEDRINE SULFATE-NACL 50-0.9 MG/10ML-% IV SOSY
PREFILLED_SYRINGE | INTRAVENOUS | Status: DC | PRN
  Administered 2017-03-24: 5 mg via INTRAVENOUS
  Administered 2017-03-24: 10 mg via INTRAVENOUS

## 2017-03-24 MED ORDER — ACETAMINOPHEN 325 MG PO TABS: 650.0000 mg | ORAL_TABLET | Freq: Four times a day (QID) | ORAL | Status: DC | PRN

## 2017-03-24 MED ORDER — FENTANYL CITRATE (PF) 100 MCG/2ML IJ SOLN
INTRAMUSCULAR | Status: AC
  Filled 2017-03-24: qty 2

## 2017-03-24 MED ORDER — LACTATED RINGERS IV SOLN
INTRAVENOUS | Status: DC
  Administered 2017-03-24: 10:00:00 via INTRAVENOUS

## 2017-03-24 MED ORDER — FENTANYL CITRATE (PF) 100 MCG/2ML IJ SOLN
INTRAMUSCULAR | Status: DC | PRN
  Administered 2017-03-24 (×2): 50 ug via INTRAVENOUS

## 2017-03-24 MED ORDER — METHOCARBAMOL 750 MG PO TABS
750.0000 mg | ORAL_TABLET | Freq: Four times a day (QID) | ORAL | Status: DC
  Administered 2017-03-24 – 2017-03-25 (×3): 750 mg via ORAL
  Filled 2017-03-24 (×3): qty 1

## 2017-03-24 MED ORDER — MIDAZOLAM HCL 5 MG/5ML IJ SOLN
INTRAMUSCULAR | Status: DC | PRN
  Administered 2017-03-24: 2 mg via INTRAVENOUS

## 2017-03-24 MED ORDER — LACTATED RINGERS IV SOLN
INTRAVENOUS | Status: DC | PRN
  Administered 2017-03-24 (×2): via INTRAVENOUS

## 2017-03-24 MED ORDER — ONDANSETRON HCL 4 MG PO TABS: 4.0000 mg | ORAL_TABLET | Freq: Four times a day (QID) | ORAL | Status: DC | PRN

## 2017-03-24 MED ORDER — FENTANYL CITRATE (PF) 250 MCG/5ML IJ SOLN
INTRAMUSCULAR | Status: AC
  Filled 2017-03-24: qty 5

## 2017-03-24 MED ORDER — ONDANSETRON HCL 4 MG/2ML IJ SOLN
INTRAMUSCULAR | Status: AC
  Filled 2017-03-24: qty 2

## 2017-03-24 MED ORDER — OXYCODONE-ACETAMINOPHEN 5-325 MG PO TABS
1.0000 | ORAL_TABLET | ORAL | Status: DC | PRN
  Administered 2017-03-25: 2 via ORAL
  Filled 2017-03-24: qty 2

## 2017-03-24 MED ORDER — SUGAMMADEX SODIUM 200 MG/2ML IV SOLN
INTRAVENOUS | Status: DC | PRN
  Administered 2017-03-24: 200 mg via INTRAVENOUS

## 2017-03-24 MED ORDER — ROCURONIUM BROMIDE 10 MG/ML (PF) SYRINGE
PREFILLED_SYRINGE | INTRAVENOUS | Status: AC
  Filled 2017-03-24: qty 5

## 2017-03-24 MED ORDER — ASPIRIN EC 325 MG PO TBEC
325.0000 mg | DELAYED_RELEASE_TABLET | Freq: Every day | ORAL | Status: DC
  Administered 2017-03-25: 325 mg via ORAL
  Filled 2017-03-24: qty 1

## 2017-03-24 MED ORDER — DEXAMETHASONE SODIUM PHOSPHATE 10 MG/ML IJ SOLN
INTRAMUSCULAR | Status: AC
  Filled 2017-03-24: qty 1

## 2017-03-24 MED ORDER — DOCUSATE SODIUM 100 MG PO CAPS
100.0000 mg | ORAL_CAPSULE | Freq: Two times a day (BID) | ORAL | Status: DC
  Administered 2017-03-25: 100 mg via ORAL
  Filled 2017-03-24: qty 1

## 2017-03-24 SURGICAL SUPPLY — 71 items
BANDAGE ACE 4X5 VEL STRL LF (GAUZE/BANDAGES/DRESSINGS) ×4 IMPLANT
BANDAGE ACE 6X5 VEL STRL LF (GAUZE/BANDAGES/DRESSINGS) ×4 IMPLANT
BANDAGE ELASTIC 6 VELCRO ST LF (GAUZE/BANDAGES/DRESSINGS) ×4 IMPLANT
BANDAGE ESMARK 6X9 LF (GAUZE/BANDAGES/DRESSINGS) ×2 IMPLANT
BIT DRILL CALIBRATED 1.8MM (BIT) ×2 IMPLANT
BIT DRILL LCP QC 2X140 (BIT) ×4 IMPLANT
BIT DRILL LONG 2.7 (BIT) ×2 IMPLANT
BNDG COHESIVE 4X5 TAN STRL (GAUZE/BANDAGES/DRESSINGS) ×4 IMPLANT
BNDG ESMARK 6X9 LF (GAUZE/BANDAGES/DRESSINGS) ×4
BNDG GAUZE ELAST 4 BULKY (GAUZE/BANDAGES/DRESSINGS) ×8 IMPLANT
BRUSH SCRUB SURG 4.25 DISP (MISCELLANEOUS) ×8 IMPLANT
CHLORAPREP W/TINT 26ML (MISCELLANEOUS) ×4 IMPLANT
COVER MAYO STAND STRL (DRAPES) ×4 IMPLANT
COVER SURGICAL LIGHT HANDLE (MISCELLANEOUS) ×8 IMPLANT
DRAPE C-ARM 42X72 X-RAY (DRAPES) ×4 IMPLANT
DRAPE C-ARMOR (DRAPES) ×4 IMPLANT
DRAPE HALF SHEET 40X57 (DRAPES) ×8 IMPLANT
DRAPE ORTHO SPLIT 77X108 STRL (DRAPES) ×4
DRAPE SURG ORHT 6 SPLT 77X108 (DRAPES) ×4 IMPLANT
DRAPE U-SHAPE 47X51 STRL (DRAPES) ×4 IMPLANT
DRILL BIT LONG 2.7 (BIT) ×4
DRILL CALIBRATED 1.8MM (BIT) ×4
DRSG ADAPTIC 3X8 NADH LF (GAUZE/BANDAGES/DRESSINGS) ×4 IMPLANT
DRSG EMULSION OIL 3X3 NADH (GAUZE/BANDAGES/DRESSINGS) IMPLANT
ELECT REM PT RETURN 9FT ADLT (ELECTROSURGICAL) ×4
ELECTRODE REM PT RTRN 9FT ADLT (ELECTROSURGICAL) ×2 IMPLANT
GAUZE SPONGE 4X4 12PLY STRL (GAUZE/BANDAGES/DRESSINGS) ×4 IMPLANT
GLOVE BIO SURGEON STRL SZ7.5 (GLOVE) ×16 IMPLANT
GLOVE BIOGEL PI IND STRL 7.0 (GLOVE) ×2 IMPLANT
GLOVE BIOGEL PI IND STRL 7.5 (GLOVE) ×2 IMPLANT
GLOVE BIOGEL PI INDICATOR 7.0 (GLOVE) ×2
GLOVE BIOGEL PI INDICATOR 7.5 (GLOVE) ×2
GLOVE SURG SS PI 7.0 STRL IVOR (GLOVE) ×4 IMPLANT
GOWN STRL REUS W/ TWL LRG LVL3 (GOWN DISPOSABLE) ×4 IMPLANT
GOWN STRL REUS W/TWL LRG LVL3 (GOWN DISPOSABLE) ×4
KIT BASIN OR (CUSTOM PROCEDURE TRAY) ×4 IMPLANT
KIT ROOM TURNOVER OR (KITS) ×4 IMPLANT
MANIFOLD NEPTUNE II (INSTRUMENTS) ×4 IMPLANT
NEEDLE HYPO 21X1.5 SAFETY (NEEDLE) IMPLANT
NS IRRIG 1000ML POUR BTL (IV SOLUTION) ×4 IMPLANT
PACK TOTAL JOINT (CUSTOM PROCEDURE TRAY) ×4 IMPLANT
PAD ABD 8X10 STRL (GAUZE/BANDAGES/DRESSINGS) ×8 IMPLANT
PAD ARMBOARD 7.5X6 YLW CONV (MISCELLANEOUS) ×8 IMPLANT
PAD CAST 4YDX4 CTTN HI CHSV (CAST SUPPLIES) IMPLANT
PADDING CAST COTTON 4X4 STRL (CAST SUPPLIES)
PADDING CAST COTTON 6X4 STRL (CAST SUPPLIES) ×4 IMPLANT
PLATE DISTAL TIB 6H 112 COMP (Plate) ×4 IMPLANT
PLATE LCP 6H 52MM 2.4MM (Plate) ×4 IMPLANT
SCREW CORTEX 2.4X14 (Screw) ×12 IMPLANT
SCREW CORTEX 2.4X16MM (Screw) ×4 IMPLANT
SCREW CORTEX 3.5X90 (Screw) ×4 IMPLANT
SCREW LOCKING VA 2.7X46 (Screw) ×12 IMPLANT
SCREW LOCKING VA 2.7X48 (Screw) ×4 IMPLANT
SCREW METAPHYSEAL 2.7X48 (Screw) ×4 IMPLANT
SCREW THRD STARDRIVE ST 2.7X44 (Screw) ×4 IMPLANT
SPONGE LAP 18X18 X RAY DECT (DISPOSABLE) ×4 IMPLANT
STAPLER VISISTAT 35W (STAPLE) ×4 IMPLANT
SUCTION FRAZIER HANDLE 10FR (MISCELLANEOUS) ×2
SUCTION TUBE FRAZIER 10FR DISP (MISCELLANEOUS) ×2 IMPLANT
SUT ETHILON 3 0 PS 1 (SUTURE) ×12 IMPLANT
SUT MNCRL AB 3-0 PS2 18 (SUTURE) ×4 IMPLANT
SUT MON AB 2-0 CT1 36 (SUTURE) ×4 IMPLANT
SUT PROLENE 0 CT (SUTURE) IMPLANT
SUT VIC AB 2-0 CT1 27 (SUTURE) ×4
SUT VIC AB 2-0 CT1 TAPERPNT 27 (SUTURE) ×4 IMPLANT
TOWEL OR 17X24 6PK STRL BLUE (TOWEL DISPOSABLE) ×8 IMPLANT
TOWEL OR 17X26 10 PK STRL BLUE (TOWEL DISPOSABLE) ×8 IMPLANT
TUBE CONNECTING 12'X1/4 (SUCTIONS) ×1
TUBE CONNECTING 12X1/4 (SUCTIONS) ×3 IMPLANT
UNDERPAD 30X30 (UNDERPADS AND DIAPERS) ×4 IMPLANT
WATER STERILE IRR 1000ML POUR (IV SOLUTION) ×8 IMPLANT

## 2017-03-24 NOTE — Op Note (Signed)
OrthopaedicSurgeryOperativeNote (WUJ:[id number]) Date of Surgery: 03/24/2017  Admit Date: 03/24/2017   Diagnoses: Pre-Op Diagnoses: Closed right pilon fracture, initial encounter [S82.871A]   Post-Op Diagnosis: Same  Procedures: 1. CPT 27828-ORIF of pilon (tibia and fibula) 2. CPT 20694-Removal of external fixator 3. CPT 11044-Ex-fix pin site debridment  Surgeons: Primary: Roby Lofts, MD   Location:[facility] OR ROOM 03   AnesthesiaGeneral   Antibiotics:Ancef 2g preop   Tourniquettime:120 min at  EstimatedBloodLoss:Minimal  Complications: None   Specimens: None  Implants: Implant Name Type Inv. Item Serial No. Manufacturer Lot No. LRB No. Used Action  PLATE DISTAL TIB 6H 112 COMP - [id number] Plate PLATE DISTAL TIB 6H 112 COMP  [employer]ES TRAUMA  Right 1 Implanted  SCREW THRD STARDRIVE ST 2.7X44 - [id number] Screw SCREW THRD STARDRIVE ST 2.7X44  [employer]THES MAXILLOFACIAL  Right 1 Implanted  SCREW METAPHYSEAL 2.7X48 - [id number] Screw SCREW METAPHYSEAL 2.7X48  [employer] TRAUMA  Right 1 Implanted  SCREW LOCKING VA 2.7X46 - [id number] Screw SCREW LOCKING VA 2.7X46  [employer] TRAUMA  Right 3 Implanted  SCREW LOCKING VA 2.7X48 - [id number] Screw SCREW LOCKING VA 2.7X48  [employer]UMA  Right 1 Implanted  SCREW CORTEX 3.5X90 - [id number] Screw SCREW CORTEX 3.5X90  [employer] TRAUMA  Right 1 Implanted  PLATE LCP 6H 52MM 2.4MM - [id number] Plate PLATE LCP 6H 52MM 2.4MM  [employer] TRAUMA  Right 1 Implanted  SCREW CORTEX 2.4X14 - [id number] Screw SCREW CORTEX 2.4X14  [employer] TRAUMA  Right 3 Implanted  SCREW CORTEX 2.4X16MM - [id number] Screw SCREW CORTEX 2.4X16MM  [employer] TRAUMA  Right 1 Implanted    IndicationsforSurgery: This is a 37 year old male who was on a ladder and fell off and sustained an intra-articular pilon fracture.  I took him last week for external fixation after closed reduction.  I allowed his swelling to return to a normal level prior to performing ORIF.  Discussed  risks and benefits with the patient. Risks discussed included bleeding requiring blood transfusion, bleeding causing a hematoma, infection, malunion, nonunion, damage to surrounding nerves and blood vessels, pain, hardware prominence or irritation, hardware failure, stiffness, post-traumatic arthritis, DVT/PE, compartment syndrome, and even death.  The patient agreed to proceed with surgery and consent was obtained.  Operative Findings: 1. Comminuted intra-articular tibial pilon fracture treated with ORIF through anteromedial incision with 2.51mm mini fragment plate along medial cortex and Synthes 6-hole VA anterolateral distal tibial plate 2.  Intramedullary screw fixation of fibular fracture using a 90mm 3.5 millimeter screw. 3.  Removal of external fixation with debridement of the ex-fix pin sites.  Procedure: The patient was identified in the preoperative holding area. Consent was confirmed with the patient and their family and all questions were answered. The operative extremity was marked after confirmation with the patient. . Patient was then brought back to the operating room by our anesthesia colleagues. The patient was transferred to a radiolucent flat top table.  They were placed under general anesthetic. The external fixator was removed and a upper thigh tourniquet was placed. The operative extremity was then prepped and draped in usual sterile fashion. A preoperative timeout was performed to verify the patient, the procedure, and the extremity. Preoperative antibiotics were dosed.  Fluoroscopic images were used to confirm the displacement and unstable nature of the fracture.  I then marked out an anteromedial incision.  The leg was exsanguinated with an Esmarch and the tourniquet was inflated to 300 mmHg.  Total tourniquet time was 120 minutes.  I carefully incised through the skin and  subcutaneous tissue taking care not to elevate skin flaps.  I carried this straight down to the periosteum.   Just medial to the anterior tibialis tendon, I incised through the periosteum and continued this all the way down to the joint itself.  I made a capsulotomy vertical in line with my periosteal incision.  I took care not to devitalized the anterior lateral fragment.  At this point I opened up the fracture plane the used irrigation to clean out the previous hematoma and bony fragments.  There were 3 main joint fragments.  There was a medial joint fragment, anteriolateral and a posterolateral fragment.  I first started by reducing the medial joint fragment to the tibial shaft.  I held this in place with a clamp.  I then proceeded to reduce the anterior lateral fragment to the medial fragment.  This was done with a clamp as well.  Lastly I reduce the posterior lateral fragment to the anterior lateral fragment making a small percutaneous incision posterior to the fibula yet anterior to the Achilles tendon.  I carefully used blunt dissection to place the of the tine clamp along the posterior lateral aspect of the tibial plafond.  I placed the other tine along the anterolateral fragment. Fluoroscopic images were obtained to verify location of the clamps as well as anatomic reduction of the fracture and articular surface.   I then contoured a 6-hole 2.4 mm mini frag plate placed along the medial cortex to hold that reduction in place.  I placed unicortical screws along this fracture to provisionally hold this.  I was able to take the clamp that was holding this reduction of the medial fragment to the shaft.  I then proceeded to place a 2.7 mm lag screw from anterior to posterior to fix the anterior lateral fragment to the posterior lateral fragment.  I removed the clamp that was holding this reduction.  Lastly, I placed 1.6 mm K wires from medial lateral to hold the reduction of the medial fragment to the anterior lateral fragment and posterior lateral fragment.  At this point I had provisional fixation of the articular  surface to the shaft and was able to remove all the clamps.  Fluoroscopy was used to confirm adequate reduction.  I then contoured a anterior lateral plate to fit anatomically over the distal aspect of the tibia.  I slid this submuscularly up the tibial shaft.  I held this provisionally with a K wire and then placed a nonlocking 2.107mm screw distally to make the plate flush to the distal tubial articular surface.  I placed a locking screw in the medial fragment through the plate.  I then made percutaneous incisions along the tibial shaft to place a 3.5 mm locking screws to bring the plate flush down to the bone.  I then returned to remove the K wires after I placed a few more locking screws.  I confirmed placement of the screws with AP and lateral fluoroscopic imaging.  Lastly I used AP and lateral to guide a 3.5 millimeter screw in the medullary canal of the fibula.  A 3.5 mm drill bit was placed through a percutaneous incision.  I overdrilled the distal fragment of the fibula and then placed a 2.5 mm drill bit up the medullary canal.  I then measured the length and placed a 90 mm screw.  I then obtained excellent purchase of the fracture.  The incisions were then copiously irrigated.  I placed 1 g of vancomycin powder.  I  closed the periosteum with #1 Vicryl suture.  The skin was closed with 2-0 Vicryl and 3-0 nylon suture.  The remainder of the percutaneous incisions were closed with 3-0 nylon suture.  I then removed the Ioban that was covering up the ex-fix sites and debride these with a curette and irrigated these as well.  These were left open for secondary intention healing.  A sterile dressing consisting of bacitracin ointment, Adaptic, 4 x 4's and sterile cast padding was placed.  A well-padded short leg splint was then applied.  The patient was awoken from anesthesia and taken to the PACU in stable condition.  Post Op Plan/Instructions: Patient will be nonweightbearing.  He will be admitted for  observation overnight for pain control.  He will be discharged home on postoperative day 1.  He will continue on aspirin for DVT prophylaxis. He will receive postoperative antibiotics.  I was present and performed the entire surgery.  Truitt Merle, MD [facility]

## 2017-03-24 NOTE — Anesthesia Preprocedure Evaluation (Signed)
Anesthesia Evaluation  Patient identified by MRN, date of birth, ID band Patient awake    Reviewed: Allergy & Precautions, NPO status , Patient's Chart, lab work & pertinent test results  Airway Mallampati: II  TM Distance: >3 FB Neck ROM: Full    Dental no notable dental hx. (+) Teeth Intact, Dental Advisory Given   Pulmonary neg pulmonary ROS, Current Smoker,    Pulmonary exam normal breath sounds clear to auscultation       Cardiovascular negative cardio ROS Normal cardiovascular exam Rhythm:Regular Rate:Normal     Neuro/Psych negative neurological ROS  negative psych ROS   GI/Hepatic negative GI ROS, Neg liver ROS,   Endo/Other  negative endocrine ROS  Renal/GU negative Renal ROS     Musculoskeletal negative musculoskeletal ROS (+)   Abdominal   Peds  Hematology negative hematology ROS (+)   Anesthesia Other Findings   Reproductive/Obstetrics                             Anesthesia Physical  Anesthesia Plan  ASA: I  Anesthesia Plan: General   Post-op Pain Management: GA combined w/ Regional for post-op pain   Induction: Intravenous  PONV Risk Score and Plan: Ondansetron and Dexamethasone  Airway Management Planned: Oral ETT  Additional Equipment:   Intra-op Plan:   Post-operative Plan: Extubation in OR  Informed Consent: I have reviewed the patients History and Physical, chart, labs and discussed the procedure including the risks, benefits and alternatives for the proposed anesthesia with the patient or authorized representative who has indicated his/her understanding and acceptance.   Dental advisory given  Plan Discussed with: Anesthesiologist and CRNA  Anesthesia Plan Comments:         Anesthesia Quick Evaluation

## 2017-03-24 NOTE — Anesthesia Procedure Notes (Signed)
Anesthesia Regional Block: Popliteal block   Pre-Anesthetic Checklist: ,, timeout performed, Correct Patient, Correct Site, Correct Laterality, Correct Procedure, Correct Position, site marked, Risks and benefits discussed,  Surgical consent,  Pre-op evaluation,  At surgeon's request and post-op pain management  Laterality: Right  Prep: chloraprep       Needles:  Injection technique: Single-shot  Needle Type: Stimiplex     Needle Length: 10cm  Needle Gauge: 21     Additional Needles:   Procedures:,,,, ultrasound used (permanent image in chart),,,,  Motor weakness within 5 minutes.  Narrative:  Start time: 03/24/2017 10:39 AM End time: 03/24/2017 10:42 AM Injection made incrementally with aspirations every 5 mL.  Performed by: Personally  Anesthesiologist: Lewie LoronGermeroth, Jeanni Allshouse, MD  Additional Notes: Nerve located and needle positioned with direct ultrasound guidance. Good perineural spread. Patient tolerated well.

## 2017-03-24 NOTE — Anesthesia Procedure Notes (Signed)
Anesthesia Regional Block: Adductor canal block   Pre-Anesthetic Checklist: ,, timeout performed, Correct Patient, Correct Site, Correct Laterality, Correct Procedure, Correct Position, site marked, Risks and benefits discussed,  Surgical consent,  Pre-op evaluation,  At surgeon's request and post-op pain management  Laterality: Right  Prep: chloraprep       Needles:  Injection technique: Single-shot  Needle Type: Stimiplex     Needle Length: 9cm  Needle Gauge: 21     Additional Needles:   Procedures:,,,, ultrasound used (permanent image in chart),,,,  Narrative:  Start time: 03/24/2017 10:36 AM End time: 03/24/2017 10:38 AM Injection made incrementally with aspirations every 5 mL.  Performed by: Personally  Anesthesiologist: Lewie LoronGermeroth, Kizzi Overbey, MD  Additional Notes: BP cuff, EKG monitors applied. Sedation begun. Artery and nerve location verified with U/S and anesthetic injected incrementally, slowly, and after negative aspirations under direct u/s guidance. Good fascial /perineural spread. Tolerated well.

## 2017-03-24 NOTE — Anesthesia Procedure Notes (Signed)
Procedure Name: Intubation Date/Time: 03/24/2017 10:57 AM Performed by: Julian ReilWelty, Tenelle Andreason F, CRNA Pre-anesthesia Checklist: Patient identified, Emergency Drugs available, Suction available, Patient being monitored and Timeout performed Patient Re-evaluated:Patient Re-evaluated prior to induction Oxygen Delivery Method: Circle system utilized Preoxygenation: Pre-oxygenation with 100% oxygen Induction Type: IV induction Ventilation: Mask ventilation without difficulty Laryngoscope Size: Miller and 3 Grade View: Grade I Tube type: Oral Tube size: 7.5 mm Number of attempts: 1 Airway Equipment and Method: Stylet Placement Confirmation: ETT inserted through vocal cords under direct vision,  positive ETCO2 and breath sounds checked- equal and bilateral Secured at: 23 cm Tube secured with: Tape Dental Injury: Teeth and Oropharynx as per pre-operative assessment  Comments: Pre-op noted upper front 2 teeth, chipped, "worn down".  4x4s bite block used.

## 2017-03-24 NOTE — Interval H&P Note (Signed)
History and Physical Interval Note:  Patient returns today for stage 2 for ORIF of right pilon fracture. Swelling is appropriate for incision risks and benefits discussed plan to proceed with surgery.  03/24/2017 10:07 AM  Miguel Reid  has presented today for surgery, with the diagnosis of Right pilon fracture  The various methods of treatment have been discussed with the patient and family. After consideration of risks, benefits and other options for treatment, the patient has consented to  Procedure(s): ORIF of right Pilon Fracture (Right) REMOVAL EXTERNAL FIXATION LEG (Right) as a surgical intervention .  The patient's history has been reviewed, patient examined, no change in status, stable for surgery.  I have reviewed the patient's chart and labs.  Questions were answered to the patient's satisfaction.     Caryn BeeKevin P Yazmeen Woolf

## 2017-03-24 NOTE — Transfer of Care (Signed)
Immediate Anesthesia Transfer of Care Note  Patient: Miguel Reid  Procedure(s) Performed: ORIF of right Pilon Fracture (Right Ankle) REMOVAL EXTERNAL FIXATION LEG (Right )  Patient Location: PACU  Anesthesia Type:General and Regional  Level of Consciousness: awake, oriented and patient cooperative  Airway & Oxygen Therapy: Patient Spontanous Breathing and Patient connected to face mask oxygen  Post-op Assessment: Report given to RN and Post -op Vital signs reviewed and stable  Post vital signs: Reviewed and stable  Last Vitals:  Vitals:   03/24/17 0946  BP: 131/80  Pulse: 73  Resp: 18  Temp: 36.8 C  SpO2: 99%    Last Pain:  Vitals:   03/24/17 1019  TempSrc:   PainSc: 8          Complications: No apparent anesthesia complications

## 2017-03-25 MED ORDER — METHOCARBAMOL 750 MG PO TABS: 750.0000 mg | ORAL_TABLET | Freq: Four times a day (QID) | ORAL | 0 refills | Status: AC

## 2017-03-25 MED ORDER — OXYCODONE-ACETAMINOPHEN 5-325 MG PO TABS
1.0000 | ORAL_TABLET | ORAL | 0 refills | Status: AC | PRN
Start: 2017-03-25 — End: ?

## 2017-03-25 NOTE — Progress Notes (Addendum)
Pt states that he has crutches at home that he received his last admission on 3/7. RN tried calling CM and CM office to get sign off but still no answer. RN called Dr. Jena GaussHaddix pt does not need PT/OT eval he passed last week and he is aware of his weight bearing status per MD. RN will administer Last dose of ancef and will D/C patient Home because he persist he is ready to leave.

## 2017-03-25 NOTE — Discharge Instructions (Signed)
[facility] Discharge Instructions   General Discharge Instructions  WEIGHT BEARING STATUS: Nonweight bearing  RANGE OF MOTION/ACTIVITY: Keep splint clean and dry  Wound Care: Keep splint clean and dry  DVT/PE prophylaxis: Aspirin daily  Diet: as you were eating previously.  Can use over the counter stool softeners and bowel preparations, such as Miralax, to help with bowel movements.  Narcotics can be constipating.  Be sure to drink plenty of fluids  PAIN MEDICATION USE AND EXPECTATIONS  You have likely been given narcotic medications to help control your pain.  After a traumatic event that results in an fracture (broken bone) with or without surgery, it is ok to use narcotic pain medications to help control one's pain.  We understand that everyone responds to pain differently and each individual patient will be evaluated on a regular basis for the continued need for narcotic medications. Ideally, narcotic medication use should last no more than 6-8 weeks (coinciding with fracture healing).   As a patient it is your responsibility as well to monitor narcotic medication use and report the amount and frequency you use these medications when you come to your office visit.   We would also advise that if you are using narcotic medications, you should take a dose prior to therapy to maximize you participation.  DO NOT USE NONSTEROIDAL ANTI-INFLAMMATORY DRUGS (NSAID'S)  Using products such as Advil (ibuprofen), Aleve (naproxen), Motrin (ibuprofen) for additional pain control during fracture healing can delay and/or prevent the healing response.  If you would like to take over the counter (OTC) medication, Tylenol (acetaminophen) is ok.  However, some narcotic medications that are given for pain control contain acetaminophen as well. Therefore, you should not exceed more than 4000 mg of tylenol in a day if you do not have liver disease.  Also note that there are may OTC medicines, such  as cold medicines and allergy medicines that my contain tylenol as well.  If you have any questions about medications and/or interactions please ask your doctor/PA or your pharmacist.      ICE AND ELEVATE INJURED/OPERATIVE EXTREMITY  Using ice and elevating the injured extremity above your heart can help with swelling and pain control.  Icing in a pulsatile fashion, such as 20 minutes on and 20 minutes off, can be followed.    Do not place ice directly on skin. Make sure there is a barrier between to skin and the ice pack.    Using frozen items such as frozen peas works well as the conform nicely to the are that needs to be iced.   IF YOU ARE IN A SPLINT OR CAST DO NOT REMOVE IT FOR ANY REASON   If your splint gets wet for any reason please contact the office immediately. You may shower in your splint or cast as long as you keep it dry.  This can be done by wrapping in a cast cover or garbage back (or similar)  Do Not stick any thing down your splint or cast such as pencils, money, or hangers to try and scratch yourself with.  If you feel itchy take benadryl as prescribed on the bottle for itching  CALL THE OFFICE WITH ANY QUESTIONS OR CONCERNS: [phone]

## 2017-03-25 NOTE — Progress Notes (Signed)
MD said pt is good to leave without having last ancef. Pt persistent on leaving

## 2017-03-25 NOTE — Discharge Summary (Signed)
[facility] ([facility])  Patient ID: Miguel Reid MRN: [id number] DOB/AGE: Sep 07, 1980 37 y.o.  Admit date: 03/24/2017 Discharge date: 03/25/2017  Admission Diagnoses:Closed right pilon fracture, initial encounter  Discharge Diagnoses:  Principal Problem:   Closed right pilon fracture, initial encounter   Past Medical History:  Diagnosis Date  . GERD (gastroesophageal reflux disease)    occasionally   no meds  . Medical history non-contributory      Procedures Performed: 03/24/2017: 1. CPT 27828-ORIF of pilon (tibia and fibula) 2. CPT 20694-Removal of external fixator 3. CPT 11044-Ex-fix pin site debridment  Discharged Condition: good  Hospital Course: Patient was admitted for observation after his procedure.  He did well overnight.  No pain.  Was discharged home on postoperative day 1.  Consults: None  Significant Diagnostic Studies: None  Treatments: surgery: as above  Discharge Exam: Vitals:   03/24/17 2057 03/25/17 0500  BP: 116/73 112/66  Pulse: 89 78  Resp: 16 17  Temp: 98.4 F (36.9 C) 98.3 F (36.8 C)  SpO2: 96% 98%   Right lower extremity: Splint is clean dry and intact.  Unable to wiggle toes due to the block that still in place.  Sensation is diminished due to block.  He is warm well-perfused toes.  Disposition: Home   Allergies as of 03/25/2017   No Known Allergies     Medication List    TAKE these medications   aspirin EC 325 MG tablet Take 1 tablet (325 mg total) by mouth daily.   methocarbamol 750 MG tablet Commonly known as:  ROBAXIN-750 Take 1 tablet (750 mg total) by mouth 4 (four) times daily.   oxyCODONE-acetaminophen 5-325 MG tablet Commonly known as:  PERCOCET/ROXICET Take 1-2 tablets by mouth every 4 (four) hours as needed for severe pain.      Follow-up Information    Haddix, Gillie MannersKevin P, MD. Schedule an appointment as soon as possible for a visit in 2 week(s).   Specialty:  Orthopedic Surgery Contact  information: [address] [place] [id number] [phone]           Discharge Instructions and Plan: Patient will be discharged home today.  He will follow-up with me in 2 weeks.  He will be on aspirin for DVT prophylaxis.  Signed:  Roby LoftsKevin P. Haddix, MD [facility] [phone] (phone)  03/25/2017, 6:45 AM

## 2017-03-25 NOTE — Progress Notes (Signed)
RN gave patient discharge instructions pt states understanding. Asked for pain medication upon leaving RN administered.

## 2017-03-27 ENCOUNTER — Encounter (HOSPITAL_COMMUNITY): Payer: Self-pay | Admitting: Student

## 2017-03-27 NOTE — Anesthesia Postprocedure Evaluation (Signed)
Anesthesia Post Note  Patient: Miguel Reid  Procedure(s) Performed: ORIF of right Pilon Fracture (Right Ankle) REMOVAL EXTERNAL FIXATION LEG (Right )     Patient location during evaluation: PACU Anesthesia Type: General Level of consciousness: sedated and patient cooperative Pain management: pain level controlled Vital Signs Assessment: post-procedure vital signs reviewed and stable Respiratory status: spontaneous breathing Cardiovascular status: stable Anesthetic complications: no    Last Vitals:  Vitals:   03/24/17 2057 03/25/17 0500  BP: 116/73 112/66  Pulse: 89 78  Resp: 16 17  Temp: 36.9 C 36.8 C  SpO2: 96% 98%    Last Pain:  Vitals:   03/25/17 0500  TempSrc: Oral  PainSc:                  Lewie LoronJohn Daron Stutz

## 2019-06-14 IMAGING — CT CT 3D ACQUISTION [facility]
3 series · 9 of 16 positions shown, 11 images · non-contrast
Comparison: Intraoperative exam 03/16/2017

CLINICAL DATA: Right ankle fracture status post external fixation
and closed reduction

EXAM:
CT OF THE RIGHT ANKLE WITHOUT CONTRAST
TECHNIQUE: Multidetector CT imaging of the right ankle was performed according
to the standard protocol. Multiplanar CT image reconstructions were
also generated.
Three-dimensional images were also rendered on a separate
workstation.

[Series 4: lower ext 1.5 st · axial · 0.38mm/px · z∈[-928,-730]mm · 5 of 198 slices shown, 7 images]
[im 33/198  soft-tissue]
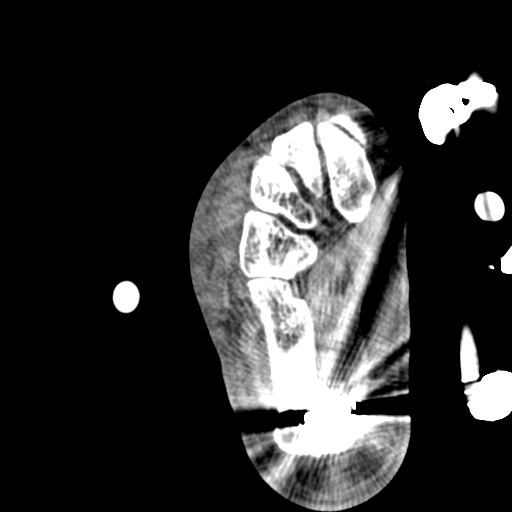
[im 33/198  bone]
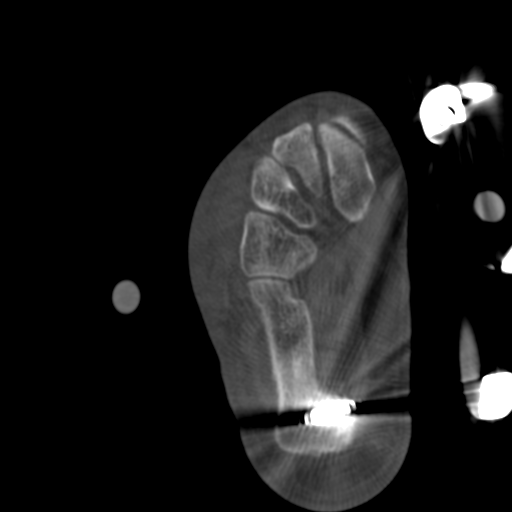
[im 66/198  soft-tissue]
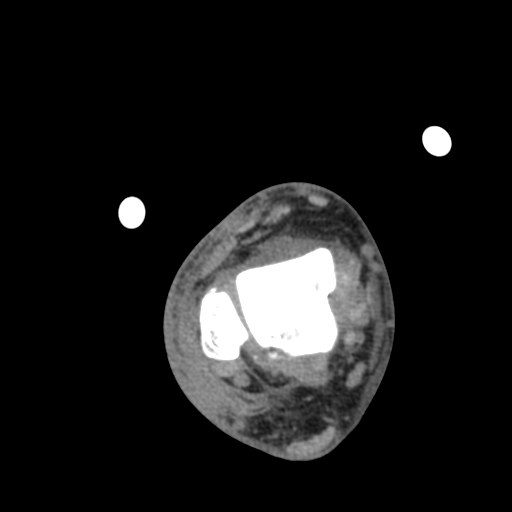
[im 99/198  soft-tissue]
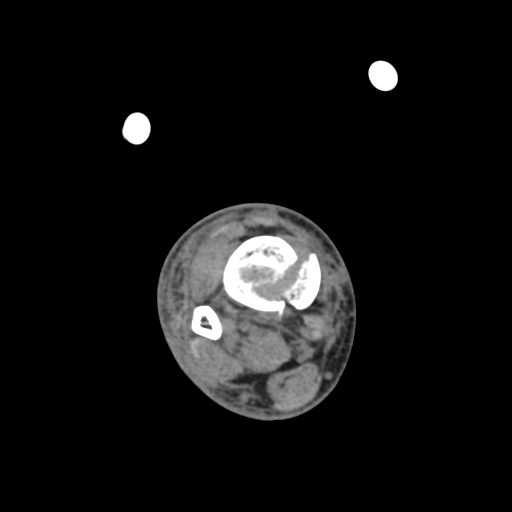
[im 132/198  soft-tissue]
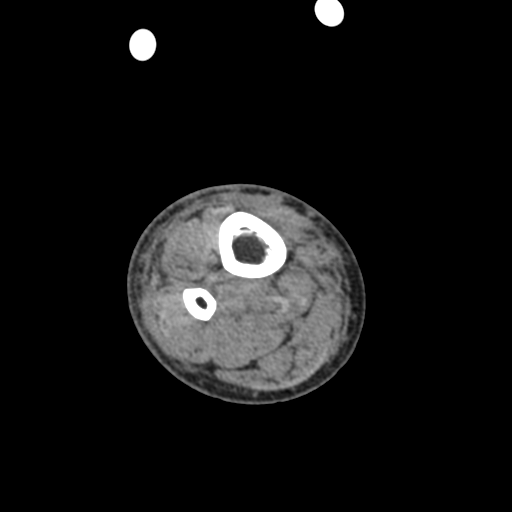
[im 165/198  soft-tissue]
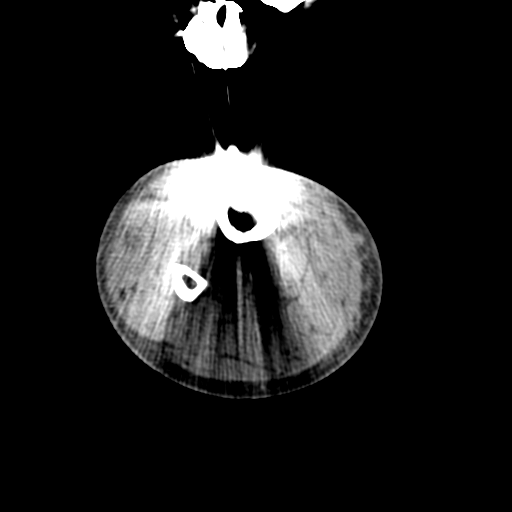
[im 165/198  bone]
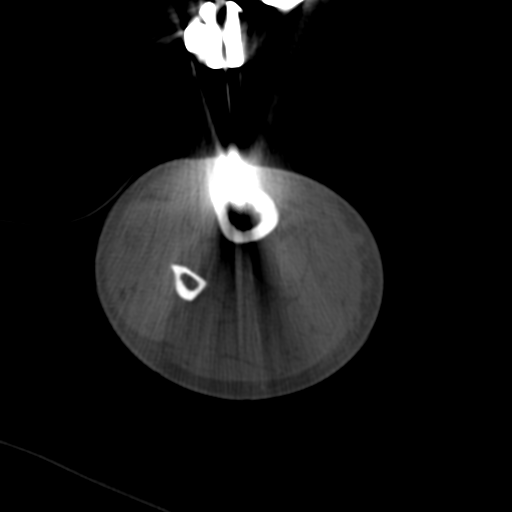

[Series 9: lower ext cor st · coronal · 0.49mm/px · 3 of 142 slices shown]
[im 36/142  soft-tissue]
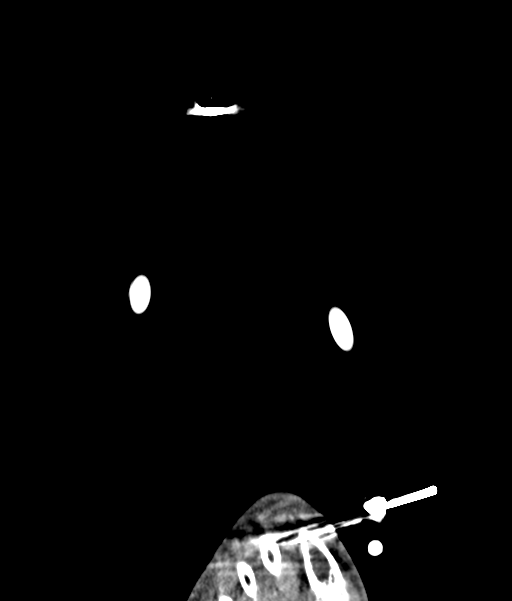
[im 71/142  soft-tissue]
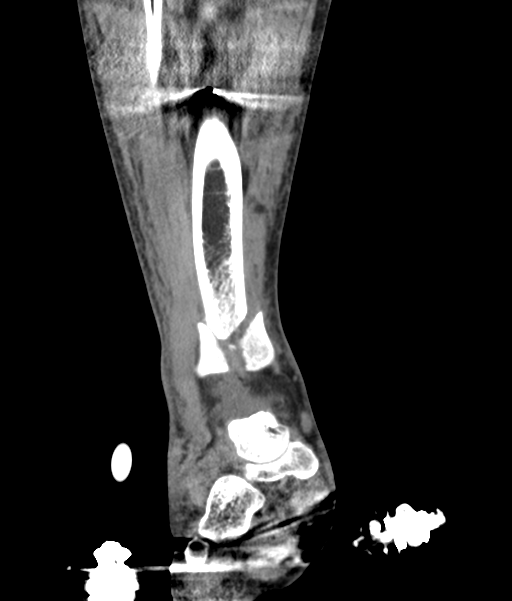
[im 106/142  soft-tissue]
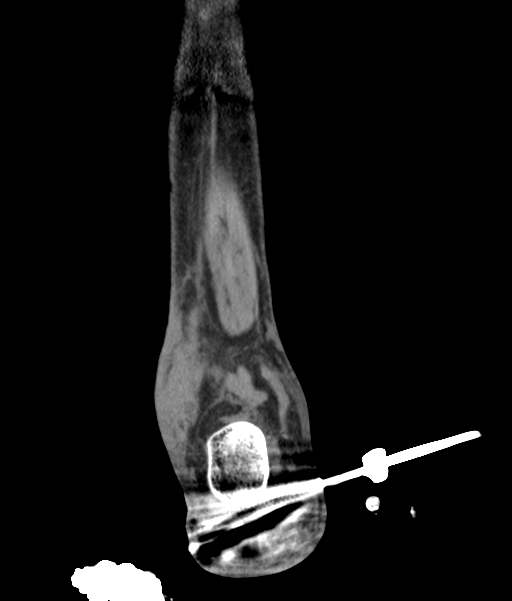

[Series 10: lower ext sag st · sagittal · 0.56mm/px · 1 of 117 slices shown]
[im 39/117  soft-tissue]
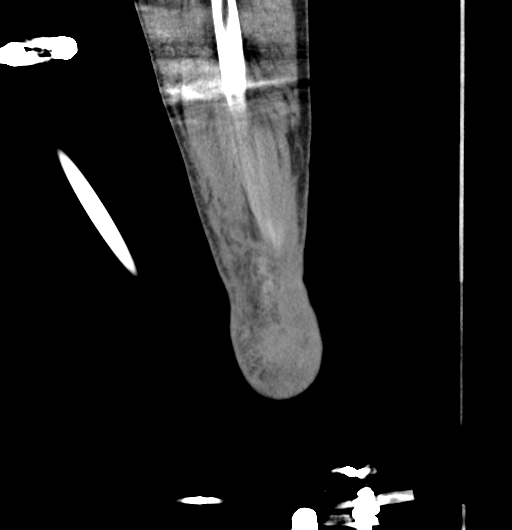

[9 of 16 positions shown; findings below may reference images not displayed]

FINDINGS: Bones/Joint/Cartilage

There is an acute fracture involving the metadiaphysis and epiphysis
of the right tibia extending into the ankle joint. The following
components comprised this fracture:

1. A coronal oblique fracture through the tibial plafond involving
the anterolateral corner of the plafond is noted with some
distraction of fracture fragments. There is approximately 9 mm of
separation anteriorly and 4 mm posteriorly.
2. A sagittal oblique fracture extending from the posterior
malleolus is identified that intersects with the coronal oblique
fracture mentioned above, series 3, image 122. This enters the ankle
joint along its middle to posterior third without significant
distraction. A few tiny ossific densities are seen interposed within
the fracture, series 8, image 71.
3. A third sagittal oblique fracture is also noted which undermines
the medial malleolus. There is approximately 5 mm of distraction of
the fracture fragments anteriorly, series 4, image 105.
4. An oblique distal diaphyseal fracture of the fibula is noted
without significant angulation or displacement.

No widening of the medial clear space.

Ligaments

Suboptimally assessed by CT.

Muscles and Tendons

A portion of the posterior tibial tendon appears to be interposed
between the fracture fragments involving the posterior malleolus,
series 4, image 118. The remainder of the tendons crossing the ankle
joint appear to be free of entrapment.

Soft tissues

Periarticular soft tissue swelling is noted without abnormal fluid
collections.

Three-[REDACTED] provided for surgeon.
IMPRESSION: 1. Comminuted [place] type fracture of the distal tibia with
nondisplaced near anatomic oblique fracture of the distal fibular
diaphysis.
2. Slight entrapment of a portion of the posterior tibial tendon is
noted, series 4, image 118.
